# Patient Record
Sex: Female | Born: 1960 | Race: Black or African American | Hispanic: No | Smoking: Never smoker
Health system: Southern US, Community
[De-identification: ages and names within clinical notes are randomized; demographics above are authoritative.]

## PROBLEM LIST (undated history)

## (undated) DIAGNOSIS — E7800 Pure hypercholesterolemia, unspecified: Secondary | ICD-10-CM

## (undated) DIAGNOSIS — E119 Type 2 diabetes mellitus without complications: Secondary | ICD-10-CM

## (undated) DIAGNOSIS — I10 Essential (primary) hypertension: Secondary | ICD-10-CM

## (undated) DIAGNOSIS — E079 Disorder of thyroid, unspecified: Secondary | ICD-10-CM

## (undated) HISTORY — PX: ABDOMINAL HYSTERECTOMY: SHX81

---

## 2011-11-05 ENCOUNTER — Emergency Department (HOSPITAL_COMMUNITY)
Admission: EM | Admit: 2011-11-05 | Discharge: 2011-11-05 | Disposition: A | Discharge status: Home / Self Care | Payer: Self-pay | Attending: Emergency Medicine | Admitting: Emergency Medicine

## 2011-11-05 ENCOUNTER — Encounter (HOSPITAL_COMMUNITY): Payer: Self-pay | Admitting: *Deleted

## 2011-11-05 ENCOUNTER — Emergency Department (HOSPITAL_COMMUNITY): Payer: Self-pay

## 2011-11-05 DIAGNOSIS — S93409A Sprain of unspecified ligament of unspecified ankle, initial encounter: Secondary | ICD-10-CM | POA: Insufficient documentation

## 2011-11-05 DIAGNOSIS — M25579 Pain in unspecified ankle and joints of unspecified foot: Secondary | ICD-10-CM | POA: Insufficient documentation

## 2011-11-05 DIAGNOSIS — M25473 Effusion, unspecified ankle: Secondary | ICD-10-CM | POA: Insufficient documentation

## 2011-11-05 DIAGNOSIS — E079 Disorder of thyroid, unspecified: Secondary | ICD-10-CM | POA: Insufficient documentation

## 2011-11-05 DIAGNOSIS — Z79899 Other long term (current) drug therapy: Secondary | ICD-10-CM | POA: Insufficient documentation

## 2011-11-05 DIAGNOSIS — W010XXA Fall on same level from slipping, tripping and stumbling without subsequent striking against object, initial encounter: Secondary | ICD-10-CM | POA: Insufficient documentation

## 2011-11-05 DIAGNOSIS — S93401A Sprain of unspecified ligament of right ankle, initial encounter: Secondary | ICD-10-CM

## 2011-11-05 DIAGNOSIS — M25476 Effusion, unspecified foot: Secondary | ICD-10-CM | POA: Insufficient documentation

## 2011-11-05 DIAGNOSIS — I10 Essential (primary) hypertension: Secondary | ICD-10-CM | POA: Insufficient documentation

## 2011-11-05 HISTORY — DX: Disorder of thyroid, unspecified: E07.9

## 2011-11-05 HISTORY — DX: Essential (primary) hypertension: I10

## 2011-11-05 MED ORDER — HYDROCODONE-ACETAMINOPHEN 5-325 MG PO TABS
1.0000 | ORAL_TABLET | Freq: Once | ORAL | Status: AC
  Administered 2011-11-05: 1 via ORAL
  Filled 2011-11-05: qty 1

## 2011-11-05 MED ORDER — HYDROCODONE-ACETAMINOPHEN 5-325 MG PO TABS: 1.0000 | ORAL_TABLET | ORAL | Status: AC | PRN

## 2011-11-05 NOTE — ED Notes (Signed)
Tripped and fell 1 week ago. Pain rt ankle, knee and hip. "It keeps giving out".

## 2011-11-05 NOTE — ED Provider Notes (Signed)
History     CSN: [id number]  Arrival date & time 11/05/11  1721   First MD Initiated Contact with Patient 11/05/11 2111      Chief Complaint  Patient presents with  . Ankle Pain    (Consider location/radiation/quality/duration/timing/severity/associated sxs/prior treatment) HPI Comments: The patient is a 51 year old woman who says she tripped and fell a week ago, injuring her right ankle and knee. Her right ankle and has persistently swollen and makes it hard for her to walk. She therefore seeks evaluation. She had no prior treatment for her ankle pain her past history is noteworthy for hypertension.  Patient is a 51 y.o. female presenting with ankle pain. The history is provided by the patient. No language interpreter was used.  Ankle Pain  The incident occurred more than 1 week ago. The injury mechanism was a fall. The pain is present in the right ankle and right knee. The quality of the pain is described as aching. The pain is at a severity of 8/10. The pain is severe. The pain has been constant since onset. She has tried nothing for the symptoms.    Past Medical History  Diagnosis Date  . Hypertension   . Thyroid disease     Past Surgical History  Procedure Date  . Abdominal hysterectomy     History reviewed. No pertinent family history.  History  Substance Use Topics  . Smoking status: Never Smoker   . Smokeless tobacco: Not on file  . Alcohol Use: No    OB History    Grav Para Term Preterm Abortions TAB SAB Ect Mult Living                  Review of Systems  All other systems reviewed and are negative.    Allergies  Aspirin  Home Medications   Current Outpatient Rx  Name Route Sig Dispense Refill  . LEVOTHYROXINE SODIUM PO Oral Take 1 tablet by mouth daily.    Marland Kitchen METFORMIN HCL 500 MG PO TABS Oral Take 500 mg by mouth daily.    Marland Kitchen MOEXIPRIL-HYDROCHLOROTHIAZIDE 15-12.5 MG PO TABS Oral Take 1 tablet by mouth daily.    Marland Kitchen NAPROXEN SODIUM 220 MG PO CAPS  Oral Take 440 mg by mouth daily as needed. For pain    . BYSTOLIC PO Oral Take 1 tablet by mouth daily.      BP 181/82  Pulse 61  Temp(Src) 97.5 F (36.4 C) (Oral)  Resp 20  Ht 5\' 6"  (1.676 m)  Wt 185 lb (83.915 kg)  BMI 29.86 kg/m2  SpO2 100%  Physical Exam  Nursing note and vitals reviewed. Constitutional: She is oriented to person, place, and time. She appears well-developed and well-nourished. No distress.       Systolic blood pressure was high at 181.  HENT:  Head: Normocephalic and atraumatic.  Neck: Normal range of motion. Neck supple.  Musculoskeletal:       Her right knee has no palpable deformity or ligamentous instability there is no effusion. The right ankle has swelling over the lateral malleolus. There is no bony deformity. Skin is intact. She has intact pulses sensation and tendon function in her right foot. X-rays of the right knee are negative x-ray of the right ankle shows swelling over the lateral malleolus but no fracture.  Neurological: She is alert and oriented to person, place, and time.       No sensory or motor deficit.  Skin: Skin is warm and dry.  Psychiatric:  She has a normal mood and affect. Her behavior is normal.    ED Course  Procedures (including critical care time)  Labs Reviewed - No data to display Dg Ankle Complete Right  11/05/2011  *RADIOLOGY REPORT*  Clinical Data: Ankle injury and pain.  RIGHT ANKLE - COMPLETE 3+ VIEW  Comparison: None.  Findings: Diffuse soft tissue swelling is noted.  No evidence of fracture or dislocation.  Mild degenerative spurring is seen along the inferior aspect of the medial malleolus.  Prominent plantar calcaneal spur also noted.  IMPRESSION: Diffuse soft tissue swelling.  No evidence of fracture.  Original Report Authenticated By: Danae Orleans, M.D.   Dg Knee Complete 4 Views Right  11/05/2011  *RADIOLOGY REPORT*  Clinical Data: Knee injury and pain.  RIGHT KNEE - COMPLETE 4+ VIEW  Comparison:  None.  Findings:   There is no evidence of fracture, dislocation, or joint effusion.  There is no evidence of arthropathy or other focal bone abnormality.  Soft tissues are unremarkable.  IMPRESSION: Negative.  Original Report Authenticated By: Danae Orleans, M.D.   9:30 PM I advised her to wear an ASO dressing when up. She can take hydrocodone acetaminophen every 4 hours if needed for pain. I advised her that this injury was just taking longer than she expected to heal.  1. Sprain of right ankle             Carleene Cooper III, MD 11/05/11 2132

## 2011-11-05 NOTE — Discharge Instructions (Signed)
Ankle Sprain An ankle sprain is an injury to the strong, fibrous tissues (ligaments) that hold the bones of your ankle joint together.  CAUSES Ankle sprain usually is caused by a fall or by twisting your ankle. People who participate in sports are more prone to these types of injuries.  SYMPTOMS  Symptoms of ankle sprain include:  Pain in your ankle. The pain may be present at rest or only when you are trying to stand or walk.   Swelling.   Bruising. Bruising may develop immediately or within 1 to 2 days after your injury.   Difficulty standing or walking.  DIAGNOSIS  Your caregiver will ask you details about your injury and perform a physical exam of your ankle to determine if you have an ankle sprain. During the physical exam, your caregiver will press and squeeze specific areas of your foot and ankle. Your caregiver will try to move your ankle in certain ways. An X-ray exam may be done to be sure a bone was not broken or a ligament did not separate from one of the bones in your ankle (avulsion).  TREATMENT  Certain types of braces can help stabilize your ankle. Your caregiver can make a recommendation for this. Your caregiver may recommend the use of medication for pain. If your sprain is severe, your caregiver may refer you to a surgeon who helps to restore function to parts of your skeletal system (orthopedist) or a physical therapist. HOME CARE INSTRUCTIONS  Apply ice to your injury for 1 to 2 days or as directed by your caregiver. Applying ice helps to reduce inflammation and pain.  Put ice in a plastic bag.   Place a towel between your skin and the bag.   Leave the ice on for 15 to 20 minutes at a time, every 2 hours while you are awake.   Take over-the-counter or prescription medicines for pain, discomfort, or fever only as directed by your caregiver.   Keep your injured leg elevated, when possible, to lessen swelling.   If your caregiver recommends crutches, use them as  instructed. Gradually, put weight on the affected ankle. Continue to use crutches or a cane until you can walk without feeling pain in your ankle.   If you have a plaster splint, wear the splint as directed by your caregiver. Do not rest it on anything harder than a pillow the first 24 hours. Do not put weight on it. Do not get it wet. You may take it off to take a shower or bath.   You may have been given an elastic bandage to wear around your ankle to provide support. If the elastic bandage is too tight (you have numbness or tingling in your foot or your foot becomes cold and blue), adjust the bandage to make it comfortable.   If you have an air splint, you may blow more air into it or let air out to make it more comfortable. You may take your splint off at night and before taking a shower or bath.   Wiggle your toes in the splint several times per day if you are able.  SEEK MEDICAL CARE IF:   You have an increase in bruising, swelling, or pain.   Your toes feel cold.   Pain relief is not achieved with medication.  SEEK IMMEDIATE MEDICAL CARE IF: Your toes are numb or blue or you have severe pain. MAKE SURE YOU:   Understand these instructions.   Will watch your condition.     Will get help right away if you are not doing well or get worse.  Document Released: 05/28/2005 Document Revised: 05/17/2011 Document Reviewed: 12/31/2007 [employer] Patient Information 2012 [employer]. 

## 2012-11-10 ENCOUNTER — Emergency Department (HOSPITAL_COMMUNITY)
Admission: EM | Admit: 2012-11-10 | Discharge: 2012-11-10 | Disposition: A | Discharge status: Home / Self Care | Payer: Self-pay | Attending: Emergency Medicine | Admitting: Emergency Medicine

## 2012-11-10 ENCOUNTER — Encounter (HOSPITAL_COMMUNITY): Payer: Self-pay

## 2012-11-10 DIAGNOSIS — R109 Unspecified abdominal pain: Secondary | ICD-10-CM | POA: Insufficient documentation

## 2012-11-10 DIAGNOSIS — Z8679 Personal history of other diseases of the circulatory system: Secondary | ICD-10-CM | POA: Insufficient documentation

## 2012-11-10 DIAGNOSIS — E079 Disorder of thyroid, unspecified: Secondary | ICD-10-CM | POA: Insufficient documentation

## 2012-11-10 DIAGNOSIS — Z90710 Acquired absence of both cervix and uterus: Secondary | ICD-10-CM | POA: Insufficient documentation

## 2012-11-10 DIAGNOSIS — K921 Melena: Secondary | ICD-10-CM | POA: Insufficient documentation

## 2012-11-10 DIAGNOSIS — K625 Hemorrhage of anus and rectum: Secondary | ICD-10-CM | POA: Insufficient documentation

## 2012-11-10 DIAGNOSIS — Z79899 Other long term (current) drug therapy: Secondary | ICD-10-CM | POA: Insufficient documentation

## 2012-11-10 DIAGNOSIS — I10 Essential (primary) hypertension: Secondary | ICD-10-CM | POA: Insufficient documentation

## 2012-11-10 LAB — CBC WITH DIFFERENTIAL/PLATELET
Basophils Absolute: 0 10*3/uL (ref 0.0–0.1)
Basophils Relative: 1 % (ref 0–1)
Eosinophils Relative: 1 % (ref 0–5)
Lymphocytes Relative: 27 % (ref 12–46)
MCHC: 33.3 g/dL (ref 30.0–36.0)
Monocytes Absolute: 0.4 10*3/uL (ref 0.1–1.0)
Neutro Abs: 4.5 10*3/uL (ref 1.7–7.7)
Platelets: 279 10*3/uL (ref 150–400)
RDW: 14.9 % (ref 11.5–15.5)
WBC: 6.7 10*3/uL (ref 4.0–10.5)

## 2012-11-10 LAB — OCCULT BLOOD, POC DEVICE: Fecal Occult Bld: POSITIVE — AB

## 2012-11-10 LAB — BASIC METABOLIC PANEL
CO2: 30 mEq/L (ref 19–32)
Calcium: 9.3 mg/dL (ref 8.4–10.5)
Chloride: 102 mEq/L (ref 96–112)
Creatinine, Ser: 1.17 mg/dL — ABNORMAL HIGH (ref 0.50–1.10)
GFR calc Af Amer: 60 mL/min — ABNORMAL LOW (ref >90)
Sodium: 141 mEq/L (ref 135–145)

## 2012-11-10 NOTE — ED Provider Notes (Signed)
History     This chart was scribed for Jaime Quarry, MD, MD by Smitty Pluck, ED Scribe. The patient was seen in room APA11/APA11 and the patient's care was started at 11:11 AM.   CSN: [id number]  Arrival date & time 11/10/12  1000      Chief Complaint  Patient presents with  . Rectal Bleeding     Patient is a 52 y.o. female presenting with hematochezia. The history is provided by the patient and medical records.  Rectal Bleeding Quality:  Maroon Amount:  Moderate Duration:  6 months Timing:  Intermittent Progression:  Unchanged Chronicity:  Recurrent Similar prior episodes: yes   Relieved by:  None tried Ineffective treatments:  None tried Associated symptoms: abdominal pain   Associated symptoms: no fever    HPI Comments: Jaime Jordan is a 52 y.o. female with hx of HTN, hemorrhoids and thyroid disease who presents to the Emergency Department complaining of intermittent, moderate rectal bleeding onset 6 months ago. She states the blood is dark red and mixed with stool. She had 1 episode of rectal bleeding today. She mentions associated intermittent abdominal pain. She reports that she has been seen in [facility] for same symptoms. She mentions she had colonoscopy 4 years ago and had polyps removed. Pt denies fever, chills, nausea, vomiting, diarrhea, weakness, cough, SOB and any other pain. Denies hx of anemia, gastritis and diverticulosis.    Pt is allergic to asa.   She goes to [employer] in [place]  Past Medical History  Diagnosis Date  . Hypertension   . Thyroid disease     Past Surgical History  Procedure Laterality Date  . Abdominal hysterectomy      No family history on file.  History  Substance Use Topics  . Smoking status: Never Smoker   . Smokeless tobacco: Not on file  . Alcohol Use: No    OB History   Grav Para Term Preterm Abortions TAB SAB Ect Mult Living                  Review of Systems  Constitutional: Negative for fever  and chills.  Gastrointestinal: Positive for abdominal pain, blood in stool, hematochezia and anal bleeding.  All other systems reviewed and are negative.    Allergies  Aspirin  Home Medications   Current Outpatient Rx  Name  Route  Sig  Dispense  Refill  . carvedilol (COREG) 3.125 MG tablet   Oral   Take 3.125 mg by mouth daily.         . celecoxib (CELEBREX) 200 MG capsule   Oral   Take 200 mg by mouth daily.          Marland Kitchen esomeprazole (NEXIUM) 40 MG capsule   Oral   Take 40 mg by mouth daily before breakfast.         . levothyroxine (SYNTHROID, LEVOTHROID) 137 MCG tablet   Oral   Take 137 mcg by mouth daily before breakfast.         . lisinopril-hydrochlorothiazide (PRINZIDE,ZESTORETIC) 20-12.5 MG per tablet   Oral   Take 1 tablet by mouth daily.         . metFORMIN (GLUCOPHAGE-XR) 500 MG 24 hr tablet   Oral   Take 500 mg by mouth daily with breakfast.         . rosuvastatin (CRESTOR) 20 MG tablet   Oral   Take 20 mg by mouth at bedtime.  BP 179/107  Pulse 78  Temp(Src) 98.3 F (36.8 C) (Oral)  Resp 18  Ht 5\' 6"  (1.676 m)  Wt 190 lb (86.183 kg)  BMI 30.68 kg/m2  SpO2 98%  Physical Exam  Nursing note and vitals reviewed. Constitutional: She is oriented to person, place, and time. She appears well-developed and well-nourished. No distress.  HENT:  Head: Normocephalic and atraumatic.  Eyes: Conjunctivae are normal. Pupils are equal, round, and reactive to light.  Neck: Normal range of motion. Neck supple. No JVD present. No tracheal deviation present.  Cardiovascular: Normal rate, regular rhythm and normal heart sounds.   No murmur heard. Pulmonary/Chest: Effort normal and breath sounds normal. No respiratory distress. She has no wheezes. She has no rales.  Abdominal: Soft. She exhibits no distension. There is no tenderness. There is no rebound and no guarding.  Genitourinary:  RN chaperone is present during exam  Positive guaiac  test  Lymphadenopathy:    She has no cervical adenopathy.  Neurological: She is alert and oriented to person, place, and time. No cranial nerve deficit. She exhibits normal muscle tone. Coordination normal.  Skin: Skin is warm and dry.  Psychiatric: She has a normal mood and affect. Her behavior is normal. Judgment and thought content normal.    ED Course  Procedures (including critical care time) DIAGNOSTIC STUDIES: Oxygen Saturation is 98% on room air, normal by my interpretation.    COORDINATION OF CARE: 11:15 AM Discussed ED treatment with pt and pt agrees.    Results for orders placed during the hospital encounter of 11/10/12  CBC WITH DIFFERENTIAL      Result Value Range   WBC 6.7  4.0 - 10.5 K/uL   RBC 3.94  3.87 - 5.11 MIL/uL   Hemoglobin 12.2  12.0 - 15.0 g/dL   HCT 14.7  82.9 - 56.2 %   MCV 92.9  78.0 - 100.0 fL   MCH 31.0  26.0 - 34.0 pg   MCHC 33.3  30.0 - 36.0 g/dL   RDW 13.0  86.5 - 78.4 %   Platelets 279  150 - 400 K/uL   Neutrophils Relative % 66  43 - 77 %   Neutro Abs 4.5  1.7 - 7.7 K/uL   Lymphocytes Relative 27  12 - 46 %   Lymphs Abs 1.8  0.7 - 4.0 K/uL   Monocytes Relative 6  3 - 12 %   Monocytes Absolute 0.4  0.1 - 1.0 K/uL   Eosinophils Relative 1  0 - 5 %   Eosinophils Absolute 0.1  0.0 - 0.7 K/uL   Basophils Relative 1  0 - 1 %   Basophils Absolute 0.0  0.0 - 0.1 K/uL  BASIC METABOLIC PANEL      Result Value Range   Sodium 141  135 - 145 mEq/L   Potassium 3.8  3.5 - 5.1 mEq/L   Chloride 102  96 - 112 mEq/L   CO2 30  19 - 32 mEq/L   Glucose, Bld 105 (*) 70 - 99 mg/dL   BUN 12  6 - 23 mg/dL   Creatinine, Ser 6.96 (*) 0.50 - 1.10 mg/dL   Calcium 9.3  8.4 - 29.5 mg/dL   GFR calc non Af Amer 52 (*) >90 mL/min   GFR calc Af Amer 60 (*) >90 mL/min    No results found.   No diagnosis found.    MDM  Discussed with Dr. Kendell Bane and he will have office call this am with  appointment for colonoscopy.  Patient is given return precautions.  Today  has had intermittent rectal bleeding for 6 months, hemodynamically stable with normal hemoglobin and no real stool in vault although a trace piece was positive.  I personally performed the services described in this documentation, which was scribed in my presence. The recorded information has been reviewed and considered.    Jaime Quarry, MD 11/10/12 1322

## 2012-11-10 NOTE — ED Notes (Signed)
Verbalized understanding of d/c instructions without any questions, gait steady while ambulating out of room

## 2012-11-10 NOTE — ED Notes (Signed)
Pt states she has had intermittent rectal bleeding and lower abd pain for about 6 months.

## 2012-11-24 ENCOUNTER — Ambulatory Visit: Payer: Self-pay | Admitting: Gastroenterology

## 2013-07-05 ENCOUNTER — Emergency Department (HOSPITAL_COMMUNITY)
Admission: EM | Admit: 2013-07-05 | Discharge: 2013-07-05 | Disposition: A | Discharge status: Home / Self Care | Payer: Medicaid - Out of State | Attending: Emergency Medicine | Admitting: Emergency Medicine

## 2013-07-05 ENCOUNTER — Encounter (HOSPITAL_COMMUNITY): Payer: Self-pay | Admitting: Emergency Medicine

## 2013-07-05 DIAGNOSIS — E7800 Pure hypercholesterolemia, unspecified: Secondary | ICD-10-CM | POA: Insufficient documentation

## 2013-07-05 DIAGNOSIS — Z79899 Other long term (current) drug therapy: Secondary | ICD-10-CM | POA: Insufficient documentation

## 2013-07-05 DIAGNOSIS — I10 Essential (primary) hypertension: Secondary | ICD-10-CM | POA: Insufficient documentation

## 2013-07-05 DIAGNOSIS — R040 Epistaxis: Secondary | ICD-10-CM | POA: Insufficient documentation

## 2013-07-05 DIAGNOSIS — R51 Headache: Secondary | ICD-10-CM | POA: Insufficient documentation

## 2013-07-05 DIAGNOSIS — E119 Type 2 diabetes mellitus without complications: Secondary | ICD-10-CM | POA: Insufficient documentation

## 2013-07-05 DIAGNOSIS — E079 Disorder of thyroid, unspecified: Secondary | ICD-10-CM | POA: Insufficient documentation

## 2013-07-05 HISTORY — DX: Pure hypercholesterolemia, unspecified: E78.00

## 2013-07-05 HISTORY — DX: Type 2 diabetes mellitus without complications: E11.9

## 2013-07-05 LAB — PROTIME-INR
INR: 0.98 (ref 0.00–1.49)
Prothrombin Time: 12.8 seconds (ref 11.6–15.2)

## 2013-07-05 LAB — CBC WITH DIFFERENTIAL/PLATELET
Basophils Absolute: 0 10*3/uL (ref 0.0–0.1)
Basophils Relative: 0 % (ref 0–1)
EOS ABS: 0.1 10*3/uL (ref 0.0–0.7)
EOS PCT: 1 % (ref 0–5)
HCT: 39.3 % (ref 36.0–46.0)
HEMOGLOBIN: 12.6 g/dL (ref 12.0–15.0)
LYMPHS ABS: 2.1 10*3/uL (ref 0.7–4.0)
Lymphocytes Relative: 29 % (ref 12–46)
MCH: 30.5 pg (ref 26.0–34.0)
MCHC: 32.1 g/dL (ref 30.0–36.0)
MCV: 95.2 fL (ref 78.0–100.0)
MONOS PCT: 4 % (ref 3–12)
Monocytes Absolute: 0.3 10*3/uL (ref 0.1–1.0)
Neutro Abs: 4.9 10*3/uL (ref 1.7–7.7)
Neutrophils Relative %: 66 % (ref 43–77)
PLATELETS: 329 10*3/uL (ref 150–400)
RBC: 4.13 MIL/uL (ref 3.87–5.11)
RDW: 15 % (ref 11.5–15.5)
WBC: 7.3 10*3/uL (ref 4.0–10.5)

## 2013-07-05 LAB — BASIC METABOLIC PANEL
BUN: 17 mg/dL (ref 6–23)
CALCIUM: 9.4 mg/dL (ref 8.4–10.5)
CO2: 31 mEq/L (ref 19–32)
CREATININE: 1.25 mg/dL — AB (ref 0.50–1.10)
Chloride: 102 mEq/L (ref 96–112)
GFR calc Af Amer: 55 mL/min — ABNORMAL LOW (ref >90)
GFR, EST NON AFRICAN AMERICAN: 48 mL/min — AB (ref >90)
GLUCOSE: 103 mg/dL — AB (ref 70–99)
Potassium: 4 mEq/L (ref 3.7–5.3)
Sodium: 141 mEq/L (ref 137–147)

## 2013-07-05 MED ORDER — HYDROCODONE-ACETAMINOPHEN 5-325 MG PO TABS: 2.0000 | ORAL_TABLET | ORAL | Status: DC | PRN

## 2013-07-05 MED ORDER — OXYMETAZOLINE HCL 0.05 % NA SOLN
1.0000 | Freq: Once | NASAL | Status: AC
  Administered 2013-07-05: 1 via NASAL

## 2013-07-05 MED ORDER — HYDROCODONE-ACETAMINOPHEN 5-325 MG PO TABS
1.0000 | ORAL_TABLET | Freq: Once | ORAL | Status: AC
  Administered 2013-07-05: 1 via ORAL
  Filled 2013-07-05: qty 1

## 2013-07-05 MED ORDER — CEPHALEXIN 500 MG PO CAPS: 500.0000 mg | ORAL_CAPSULE | Freq: Four times a day (QID) | ORAL | Status: DC

## 2013-07-05 MED ORDER — OXYMETAZOLINE HCL 0.05 % NA SOLN
NASAL | Status: AC
  Filled 2013-07-05: qty 15

## 2013-07-05 MED ORDER — SILVER NITRATE-POT NITRATE 75-25 % EX MISC
CUTANEOUS | Status: AC
  Filled 2013-07-05: qty 2

## 2013-07-05 NOTE — ED Notes (Signed)
Pt c/o "mild" headache and nose bleed from left nare that started 20-30 minutes ago, pt hypertensive in triage 221/104, admits to taking blood [pressure medication this am, left nare still bleeding at present,

## 2013-07-05 NOTE — Discharge Instructions (Signed)

## 2013-07-05 NOTE — ED Notes (Signed)
Spontaneous nose bleed that started today. Hypertensive. Atraumatic.

## 2013-07-05 NOTE — ED Notes (Signed)
Patient with no complaints at this time. Respirations even and unlabored. Skin warm/dry. Discharge instructions reviewed with patient at this time. Patient given opportunity to voice concerns/ask questions. Patient discharged at this time and left Emergency Department with steady gait.   

## 2013-07-05 NOTE — ED Provider Notes (Signed)
CSN: [id number]     Arrival date & time 07/05/13  1137 History   First MD Initiated Contact with Patient 07/05/13 1213     Chief Complaint  Patient presents with  . Epistaxis   (Consider location/radiation/quality/duration/timing/severity/associated sxs/prior Treatment) HPI Comments: Spontaneous nosebleed from left naris started 30 minutes ago. History of hypertension. States compliance with medications. Denies any nasal trauma. No anticoagulant use. No chest pain, shortness of breath, difficulty breathing or swallowing.  The history is provided by the patient. The history is limited by the condition of the patient.    Past Medical History  Diagnosis Date  . Hypertension   . Thyroid disease   . Diabetes mellitus without complication   . High cholesterol    Past Surgical History  Procedure Laterality Date  . Abdominal hysterectomy     No family history on file. History  Substance Use Topics  . Smoking status: Never Smoker   . Smokeless tobacco: Not on file  . Alcohol Use: No   OB History   Grav Para Term Preterm Abortions TAB SAB Ect Mult Living                 Review of Systems  Constitutional: Negative for activity change and appetite change.  HENT: Positive for nosebleeds.   Respiratory: Negative for cough, chest tightness and shortness of breath.   Cardiovascular: Negative for chest pain.  Gastrointestinal: Negative for nausea, vomiting and abdominal pain.  Genitourinary: Negative for dysuria, hematuria, vaginal bleeding and vaginal discharge.  Musculoskeletal: Negative for back pain.  Skin: Negative for rash.  Neurological: Positive for headaches. Negative for dizziness.  A complete 10 system review of systems was obtained and all systems are negative except as noted in the HPI and PMH.    Allergies  Aspirin  Home Medications   Current Outpatient Rx  Name  Route  Sig  Dispense  Refill  . carvedilol (COREG) 3.125 MG tablet   Oral   Take 3.125 mg by mouth  daily.         . celecoxib (CELEBREX) 200 MG capsule   Oral   Take 200 mg by mouth daily.          Marland Kitchen esomeprazole (NEXIUM) 40 MG capsule   Oral   Take 40 mg by mouth daily before breakfast.         . levothyroxine (SYNTHROID, LEVOTHROID) 137 MCG tablet   Oral   Take 137 mcg by mouth daily before breakfast.         . lisinopril-hydrochlorothiazide (PRINZIDE,ZESTORETIC) 20-12.5 MG per tablet   Oral   Take 1 tablet by mouth daily.         . metFORMIN (GLUCOPHAGE-XR) 500 MG 24 hr tablet   Oral   Take 500 mg by mouth daily with breakfast.         . rosuvastatin (CRESTOR) 20 MG tablet   Oral   Take 20 mg by mouth at bedtime.         . cephALEXin (KEFLEX) 500 MG capsule   Oral   Take 1 capsule (500 mg total) by mouth 4 (four) times daily.   40 capsule   0   . HYDROcodone-acetaminophen (NORCO/VICODIN) 5-325 MG per tablet   Oral   Take 2 tablets by mouth every 4 (four) hours as needed.   10 tablet   0    BP 146/85  Pulse 66  Temp(Src) 97.5 F (36.4 C) (Oral)  Resp 16  SpO2 99% Physical Exam  Constitutional: She is oriented to person, place, and time. She appears well-developed and well-nourished. No distress.  HENT:  Head: Normocephalic.  Mouth/Throat: Oropharynx is clear and moist. No oropharyngeal exudate.  Active bleeding from left nasal septum, bleeding in back of oropharynx.  Eyes: Conjunctivae and EOM are normal. Pupils are equal, round, and reactive to light.  Neck: Normal range of motion. Neck supple.  Cardiovascular: Normal rate, regular rhythm and normal heart sounds.   No murmur heard. Pulmonary/Chest: Breath sounds normal. No respiratory distress.  Abdominal: Soft. There is no tenderness. There is no rebound and no guarding.  Musculoskeletal: Normal range of motion. She exhibits no edema and no tenderness.  Neurological: She is alert and oriented to person, place, and time. No cranial nerve deficit. She exhibits normal muscle tone.  Coordination normal.  Skin: Skin is warm.    ED Course  EPISTAXIS MANAGEMENT Date/Time: 07/05/2013 1:09 PM Performed by: Glynn OctaveANCOUR, Loraine Freid Authorized by: Glynn OctaveANCOUR, Janoah Menna Consent: Verbal consent obtained. The procedure was performed in an emergent situation. Risks and benefits: risks, benefits and alternatives were discussed Consent given by: patient Patient understanding: patient states understanding of the procedure being performed Patient consent: the patient's understanding of the procedure matches consent given Patient identity confirmed: verbally with patient and provided demographic data Time out: Immediately prior to procedure a "time out" was called to verify the correct patient, procedure, equipment, support staff and site/side marked as required. Local anesthetic: lidocaine 1% with epinephrine Patient sedated: no Treatment site: left anterior Repair method: cophenylcaine, nasal balloon and anterior pack Post-procedure assessment: bleeding stopped Treatment complexity: complex Patient tolerance: Patient tolerated the procedure well with no immediate complications.   (including critical care time) Labs Review Labs Reviewed  BASIC METABOLIC PANEL - Abnormal; Notable for the following:    Glucose, Bld 103 (*)    Creatinine, Ser 1.25 (*)    GFR calc non Af Amer 48 (*)    GFR calc Af Amer 55 (*)    All other components within normal limits  CBC WITH DIFFERENTIAL  PROTIME-INR   Imaging Review No results found.  EKG Interpretation   None       MDM   1. Epistaxis    Spontaneous nose bleed in setting of hypertension. No difficulty breathing, chest pain or shortness of breath.  Patient observed in ED with no further bleeding after nasal packing. Blood pressure has improved to 140 systolic. She states compliance with medications.  No blood in the oropharynx on recheck. Hemoglobin stable.   Patient will be discharged with nasal packing in place and followup with ENT  in 3 days.  BP 146/85  Pulse 66  Temp(Src) 97.5 F (36.4 C) (Oral)  Resp 16  SpO2 99%   Glynn OctaveStephen Raevyn Sokol, MD 07/05/13 1621

## 2013-07-08 ENCOUNTER — Encounter (HOSPITAL_COMMUNITY): Payer: Self-pay | Admitting: Emergency Medicine

## 2013-07-08 ENCOUNTER — Emergency Department (HOSPITAL_COMMUNITY)
Admission: EM | Admit: 2013-07-08 | Discharge: 2013-07-08 | Disposition: A | Discharge status: Home / Self Care | Payer: Medicaid - Out of State | Attending: Emergency Medicine | Admitting: Emergency Medicine

## 2013-07-08 DIAGNOSIS — Z792 Long term (current) use of antibiotics: Secondary | ICD-10-CM | POA: Insufficient documentation

## 2013-07-08 DIAGNOSIS — Z79899 Other long term (current) drug therapy: Secondary | ICD-10-CM | POA: Insufficient documentation

## 2013-07-08 DIAGNOSIS — I10 Essential (primary) hypertension: Secondary | ICD-10-CM | POA: Insufficient documentation

## 2013-07-08 DIAGNOSIS — Z4800 Encounter for change or removal of nonsurgical wound dressing: Secondary | ICD-10-CM

## 2013-07-08 DIAGNOSIS — E119 Type 2 diabetes mellitus without complications: Secondary | ICD-10-CM | POA: Insufficient documentation

## 2013-07-08 DIAGNOSIS — E7800 Pure hypercholesterolemia, unspecified: Secondary | ICD-10-CM | POA: Insufficient documentation

## 2013-07-08 DIAGNOSIS — E079 Disorder of thyroid, unspecified: Secondary | ICD-10-CM | POA: Insufficient documentation

## 2013-07-08 NOTE — ED Provider Notes (Signed)
CSN: [id number]     Arrival date & time 07/08/13  [id number] History   First MD Initiated Contact with Patient 07/08/13 0940     No chief complaint on file.  (Consider location/radiation/quality/duration/timing/severity/associated sxs/prior Treatment) HPI Comments: Patient is a 53 year old female who presented to the emergency department on Sunday, January 25 with a complaint of nosebleed. The patient was noted to have some elevation in blood pressure. This was addressed. The patient had a rhino rocket with anterior packing placed. The patient presents now for evaluation to have the packing removed. The patient states that she has not had any significant bleeding since being treated on Sunday. She has not seen her primary physician concerning her blood pressure yet. There's been no unusual headache. No unusual dizziness or lightheadedness. The patient denies excessive use of aspirin or nonsteroidal anti-inflammatory medications. She's not on any blood thinning type medications.  The history is provided by the patient.    Past Medical History  Diagnosis Date  . Hypertension   . Thyroid disease   . Diabetes mellitus without complication   . High cholesterol    Past Surgical History  Procedure Laterality Date  . Abdominal hysterectomy     History reviewed. No pertinent family history. History  Substance Use Topics  . Smoking status: Never Smoker   . Smokeless tobacco: Not on file  . Alcohol Use: No   OB History   Grav Para Term Preterm Abortions TAB SAB Ect Mult Living                 Review of Systems  Constitutional: Negative for activity change.       All ROS Neg except as noted in HPI  HENT: Positive for nosebleeds.   Eyes: Negative for photophobia and discharge.  Respiratory: Negative for cough, shortness of breath and wheezing.   Cardiovascular: Negative for chest pain and palpitations.  Gastrointestinal: Negative for abdominal pain and blood in stool.  Genitourinary: Negative  for dysuria, frequency and hematuria.  Musculoskeletal: Negative for arthralgias, back pain and neck pain.  Skin: Negative.   Neurological: Negative for dizziness, seizures and speech difficulty.  Psychiatric/Behavioral: Negative for hallucinations and confusion.    Allergies  Aspirin  Home Medications   Current Outpatient Rx  Name  Route  Sig  Dispense  Refill  . carvedilol (COREG) 3.125 MG tablet   Oral   Take 3.125 mg by mouth daily.         . celecoxib (CELEBREX) 200 MG capsule   Oral   Take 200 mg by mouth daily.          . cephALEXin (KEFLEX) 500 MG capsule   Oral   Take 1 capsule (500 mg total) by mouth 4 (four) times daily.   40 capsule   0   . esomeprazole (NEXIUM) 40 MG capsule   Oral   Take 40 mg by mouth daily before breakfast.         . HYDROcodone-acetaminophen (NORCO/VICODIN) 5-325 MG per tablet   Oral   Take 2 tablets by mouth every 4 (four) hours as needed.   10 tablet   0   . levothyroxine (SYNTHROID, LEVOTHROID) 137 MCG tablet   Oral   Take 137 mcg by mouth daily before breakfast.         . lisinopril-hydrochlorothiazide (PRINZIDE,ZESTORETIC) 20-12.5 MG per tablet   Oral   Take 1 tablet by mouth daily.         . metFORMIN (GLUCOPHAGE-XR) 500 MG 24  hr tablet   Oral   Take 500 mg by mouth daily with breakfast.         . rosuvastatin (CRESTOR) 20 MG tablet   Oral   Take 20 mg by mouth at bedtime.          BP 199/82  Pulse 66  Temp(Src) 97.6 F (36.4 C) (Oral)  Resp 16  SpO2 99% Physical Exam  Nursing note and vitals reviewed. Constitutional: She is oriented to person, place, and time. She appears well-developed and well-nourished.  Non-toxic appearance.  HENT:  Head: Normocephalic.  Right Ear: Tympanic membrane and external ear normal.  Left Ear: Tympanic membrane and external ear normal.  Rhino rocket in place in the left nostril. No bleeding appreciated. No increased redness or signs of any problem around the right or  left nostrils.  No blood in the posterior pharynx.  Eyes: EOM and lids are normal. Pupils are equal, round, and reactive to light.  Neck: Normal range of motion. Neck supple. Carotid bruit is not present.  Cardiovascular: Normal rate, regular rhythm, normal heart sounds, intact distal pulses and normal pulses.   Pulmonary/Chest: Breath sounds normal. No respiratory distress.  Abdominal: Soft. Bowel sounds are normal. There is no tenderness. There is no guarding.  Musculoskeletal: Normal range of motion.  Lymphadenopathy:       Head (right side): No submandibular adenopathy present.       Head (left side): No submandibular adenopathy present.    She has no cervical adenopathy.  Neurological: She is alert and oriented to person, place, and time. She has normal strength. No cranial nerve deficit or sensory deficit.  Skin: Skin is warm and dry.  Psychiatric: She has a normal mood and affect. Her speech is normal.    ED Course  Procedures (including critical care time) Labs Review Labs Reviewed - No data to display Imaging Review No results found.  EKG Interpretation   None       MDM  No diagnosis found. *I have reviewed nursing notes, vital signs, and all appropriate lab and imaging results for this patient.**  Patient presents for removal of a rhino rocket from the left nostril do to epistaxis.  Rhino Rocket was removed without problem. Patient's blood pressure is 199/82. This was elevated on the patient's last visit. The patient advised of her blood pressure at this time, and strongly encouraged to see her primary physician for possible adjustments in her blood pressure medications. The patient states that she is compliant with her current medications. Patient given instructions on how to apply pressure and pinch her nose if bleeding returns. Patient also given a reconstruction is to return if bleeding not controlled. Patient ate knowledge is understanding of the  instructions.  Kathie Dike, PA-C 07/08/13 1034

## 2013-07-08 NOTE — Discharge Instructions (Signed)
Your nasal packing was removed without problem today. Please apply pressure to the nostril with pinching if the bleeding should return. Please return to the emergency department if bleeding unable to be controlled. Your blood pressure is elevated. It was elevated on your previous emergency department visit. It is very important that you see your primary physician for evaluation and possible adjustment of your medications.

## 2013-07-08 NOTE — ED Provider Notes (Signed)
Medical screening examination/treatment/procedure(s) were performed by non-physician practitioner and as supervising physician I was immediately available for consultation/collaboration.  EKG Interpretation   None         Elwood Bazinet W. Bryant Saye, MD 07/08/13 1536 

## 2013-07-08 NOTE — ED Notes (Signed)
Pt had rhino rocket placed in left nostril on Sunday. Pt was told to return for re-evaluation and removal today. Pt denies any pain, bleeding at site. Pt also denies headaches, lightheadedness.

## 2013-12-18 ENCOUNTER — Emergency Department (HOSPITAL_COMMUNITY): Payer: Medicaid - Out of State

## 2013-12-18 ENCOUNTER — Encounter (HOSPITAL_COMMUNITY): Payer: Self-pay | Admitting: Emergency Medicine

## 2013-12-18 ENCOUNTER — Emergency Department (HOSPITAL_COMMUNITY)
Admission: EM | Admit: 2013-12-18 | Discharge: 2013-12-18 | Disposition: A | Discharge status: Home / Self Care | Payer: Medicaid - Out of State | Attending: Emergency Medicine | Admitting: Emergency Medicine

## 2013-12-18 DIAGNOSIS — E079 Disorder of thyroid, unspecified: Secondary | ICD-10-CM | POA: Diagnosis not present

## 2013-12-18 DIAGNOSIS — R52 Pain, unspecified: Secondary | ICD-10-CM | POA: Insufficient documentation

## 2013-12-18 DIAGNOSIS — M25559 Pain in unspecified hip: Secondary | ICD-10-CM | POA: Insufficient documentation

## 2013-12-18 DIAGNOSIS — M79609 Pain in unspecified limb: Secondary | ICD-10-CM | POA: Diagnosis present

## 2013-12-18 DIAGNOSIS — S56911A Strain of unspecified muscles, fascia and tendons at forearm level, right arm, initial encounter: Secondary | ICD-10-CM

## 2013-12-18 DIAGNOSIS — Y939 Activity, unspecified: Secondary | ICD-10-CM | POA: Diagnosis not present

## 2013-12-18 DIAGNOSIS — X58XXXA Exposure to other specified factors, initial encounter: Secondary | ICD-10-CM | POA: Diagnosis not present

## 2013-12-18 DIAGNOSIS — Z79899 Other long term (current) drug therapy: Secondary | ICD-10-CM | POA: Insufficient documentation

## 2013-12-18 DIAGNOSIS — IMO0002 Reserved for concepts with insufficient information to code with codable children: Secondary | ICD-10-CM | POA: Diagnosis not present

## 2013-12-18 DIAGNOSIS — Z791 Long term (current) use of non-steroidal anti-inflammatories (NSAID): Secondary | ICD-10-CM | POA: Diagnosis not present

## 2013-12-18 DIAGNOSIS — M25552 Pain in left hip: Secondary | ICD-10-CM

## 2013-12-18 DIAGNOSIS — Y929 Unspecified place or not applicable: Secondary | ICD-10-CM | POA: Diagnosis not present

## 2013-12-18 DIAGNOSIS — I10 Essential (primary) hypertension: Secondary | ICD-10-CM | POA: Diagnosis not present

## 2013-12-18 DIAGNOSIS — E119 Type 2 diabetes mellitus without complications: Secondary | ICD-10-CM | POA: Diagnosis not present

## 2013-12-18 LAB — CBC WITH DIFFERENTIAL/PLATELET
BASOS PCT: 0 % (ref 0–1)
Basophils Absolute: 0 10*3/uL (ref 0.0–0.1)
EOS ABS: 0.1 10*3/uL (ref 0.0–0.7)
EOS PCT: 2 % (ref 0–5)
HEMATOCRIT: 39 % (ref 36.0–46.0)
HEMOGLOBIN: 12.7 g/dL (ref 12.0–15.0)
LYMPHS ABS: 2.1 10*3/uL (ref 0.7–4.0)
Lymphocytes Relative: 31 % (ref 12–46)
MCH: 30.2 pg (ref 26.0–34.0)
MCHC: 32.6 g/dL (ref 30.0–36.0)
MCV: 92.6 fL (ref 78.0–100.0)
MONOS PCT: 6 % (ref 3–12)
Monocytes Absolute: 0.4 10*3/uL (ref 0.1–1.0)
NEUTROS PCT: 61 % (ref 43–77)
Neutro Abs: 4.2 10*3/uL (ref 1.7–7.7)
Platelets: 284 10*3/uL (ref 150–400)
RBC: 4.21 MIL/uL (ref 3.87–5.11)
RDW: 15.8 % — ABNORMAL HIGH (ref 11.5–15.5)
WBC: 6.9 10*3/uL (ref 4.0–10.5)

## 2013-12-18 LAB — BASIC METABOLIC PANEL
Anion gap: 10 (ref 5–15)
BUN: 15 mg/dL (ref 6–23)
CO2: 31 meq/L (ref 19–32)
CREATININE: 1.24 mg/dL — AB (ref 0.50–1.10)
Calcium: 9.3 mg/dL (ref 8.4–10.5)
Chloride: 101 mEq/L (ref 96–112)
GFR calc non Af Amer: 48 mL/min — ABNORMAL LOW (ref >90)
GFR, EST AFRICAN AMERICAN: 56 mL/min — AB (ref >90)
Glucose, Bld: 99 mg/dL (ref 70–99)
POTASSIUM: 4.5 meq/L (ref 3.7–5.3)
Sodium: 142 mEq/L (ref 137–147)

## 2013-12-18 LAB — TROPONIN I: Troponin I: <0.3 ng/mL (ref <0.30)

## 2013-12-18 MED ORDER — HYDROCODONE-ACETAMINOPHEN 5-325 MG PO TABS: 1.0000 | ORAL_TABLET | ORAL | Status: DC | PRN

## 2013-12-18 MED ORDER — HYDROCODONE-ACETAMINOPHEN 5-325 MG PO TABS
1.0000 | ORAL_TABLET | Freq: Once | ORAL | Status: AC
  Administered 2013-12-18: 1 via ORAL
  Filled 2013-12-18: qty 1

## 2013-12-18 NOTE — Discharge Instructions (Signed)
Your xrays are negative today for the source of your pain.  I suspect you have muscle strain and overuse syndrome from your activities with your job.  Rest and apply ice packs to your areas of pain for the next several days.  You may take the hydrocodone prescribed for pain relief.  This will make you drowsy - do not drive within 4 hours of taking this medication.

## 2013-12-18 NOTE — ED Provider Notes (Signed)
Medical screening examination/treatment/procedure(s) were conducted as a shared visit with non-physician practitioner(s) and myself.  I personally evaluated the patient during the encounter.   EKG Interpretation None     No clinical evidence of acute coronary syndrome. Suspect musculoskeletal pain  Donnetta HutchingBrian Shermika Balthaser, MD 12/18/13 1444

## 2013-12-18 NOTE — ED Provider Notes (Signed)
CSN: [id number]     Arrival date & time 12/18/13  [id number] History   First MD Initiated Contact with Patient 12/18/13 1003     Chief Complaint  Patient presents with  . Extremity Pain     (Consider location/radiation/quality/duration/timing/severity/associated sxs/prior Treatment) Patient is a 53 y.o. female presenting with extremity pain.  Extremity Pain Associated symptoms include arthralgias. Pertinent negatives include no chest pain, fever, joint swelling, myalgias, neck pain, numbness, rash, vomiting or weakness.     Mathis Dadmily Bias is a 53 y.o. female with a medical history significant for DM and htn presenting with a 2 week history of right forearm and elbow pain and left lateral hip pain.  Her right forearm is described as a deep, aching pain which is constant, even at rest, but worsens with movement.  She denies injury, but describes some lifting and increased physical activity with her current job.  Her left hip is sore and worsened with walking, weight bearing and palpation.  She denies injury or falls and denies chest pain, shortness of breath, fever, rash, neck or back pain.  She has taken aleve without relief of her symptoms.  She has mentioned her pain to her pcp at the [facility] in [place], but states has had no treatment of this.      Past Medical History  Diagnosis Date  . Hypertension   . Thyroid disease   . Diabetes mellitus without complication   . High cholesterol    Past Surgical History  Procedure Laterality Date  . Abdominal hysterectomy     History reviewed. No pertinent family history. History  Substance Use Topics  . Smoking status: Never Smoker   . Smokeless tobacco: Not on file  . Alcohol Use: No   OB History   Grav Para Term Preterm Abortions TAB SAB Ect Mult Living                 Review of Systems  Constitutional: Negative for fever.  Respiratory: Negative for shortness of breath.   Cardiovascular: Negative for chest pain.  Gastrointestinal:  Negative for vomiting and diarrhea.  Musculoskeletal: Positive for arthralgias. Negative for back pain, gait problem, joint swelling, myalgias and neck pain.  Skin: Negative for color change and rash.  Neurological: Negative for weakness and numbness.      Allergies  Aspirin  Home Medications   Prior to Admission medications   Medication Sig Start Date End Date Taking? Authorizing Provider  carvedilol (COREG) 3.125 MG tablet Take 3.125 mg by mouth daily.   Yes Historical Provider, MD  esomeprazole (NEXIUM) 40 MG capsule Take 40 mg by mouth daily as needed (acid reflux).    Yes Historical Provider, MD  levothyroxine (SYNTHROID, LEVOTHROID) 137 MCG tablet Take 137 mcg by mouth daily before breakfast.   Yes Historical Provider, MD  metFORMIN (GLUCOPHAGE-XR) 500 MG 24 hr tablet Take 500 mg by mouth daily with breakfast.   Yes Historical Provider, MD  naproxen sodium (ANAPROX) 220 MG tablet Take 440 mg by mouth daily as needed (pain).   Yes Historical Provider, MD  HYDROcodone-acetaminophen (NORCO/VICODIN) 5-325 MG per tablet Take 1 tablet by mouth every 4 (four) hours as needed for moderate pain. 12/18/13   Burgess AmorJulie Zariel Capano, PA-C   BP 179/96  Pulse 73  Temp(Src) 98.1 F (36.7 C) (Oral)  Resp 20  Ht 5\' 6"  (1.676 m)  Wt 243 lb (110.224 kg)  BMI 39.24 kg/m2  SpO2 100% Physical Exam  Constitutional: She appears well-developed and well-nourished.  HENT:  Head: Atraumatic.  Neck: Normal range of motion.  Cardiovascular:  Pulses equal bilaterally  Musculoskeletal: She exhibits tenderness.       Left hip: She exhibits bony tenderness.  Lateral left hip tender over greater trochanter.    Neurological: She is alert. She has normal strength. She displays normal reflexes. No sensory deficit.  Skin: Skin is warm and dry.  Psychiatric: She has a normal mood and affect.    ED Course  Procedures (including critical care time) Labs Review Labs Reviewed  BASIC METABOLIC PANEL - Abnormal; Notable  for the following:    Creatinine, Ser 1.24 (*)    GFR calc non Af Amer 48 (*)    GFR calc Af Amer 56 (*)    All other components within normal limits  CBC WITH DIFFERENTIAL - Abnormal; Notable for the following:    RDW 15.8 (*)    All other components within normal limits  TROPONIN I    Imaging Review Dg Forearm Right  12/18/2013   CLINICAL DATA:  Pain  EXAM: RIGHT FOREARM - 2 VIEW  COMPARISON:  None.  FINDINGS: There is no evidence of fracture or other focal bone lesions. Soft tissues are unremarkable. Normal alignment. Right radius and ulna are intact.  IMPRESSION: No acute osseous finding   Electronically Signed   By: Ruel Favors M.D.   On: 12/18/2013 11:05   Dg Hip Complete Left  12/18/2013   CLINICAL DATA:  Left hip pain.  EXAM: LEFT HIP - COMPLETE 2+ VIEW  COMPARISON:  None.  FINDINGS: There is no evidence of hip fracture or dislocation. There is no evidence of arthropathy or other focal bone abnormality.  IMPRESSION: Negative left hip radiographs   Electronically Signed   By: Gennette Pac M.D.   On: 12/18/2013 11:06     EKG Interpretation None       Date: 12/18/2013  Rate: 67  Rhythm: normal sinus rhythm  QRS Axis: normal  Intervals: normal  ST/T Wave abnormalities: T wave inversion in lateral leads  Conduction Disutrbances:none  Narrative Interpretation:   Old EKG Reviewed: obtained ekg from pcp dated 12/11/11, unchanged.      MDM   Final diagnoses:  Forearm strain, right, initial encounter  Hip pain, acute, left    Patients labs and/or radiological studies were viewed and considered during the medical decision making and disposition process. Pt's exam is most c/w musculoskeletal strain, overuse syndrome.  ekg unchanged,  Negative troponin, this is not an atypical ACS equivalent.  xrays negative for bony pathology.  Pt was encouraged avoiding activities that worsen her pain.  Heat tx, f/u with pcp ([facility] in [place]) prn if sx persist.      Burgess Amor, PA-C 12/18/13 1423

## 2013-12-18 NOTE — ED Notes (Addendum)
Pt reports right arm and left leg pain x1 week. Pt denies any injury. nad noted. Pt denies any dizziness,weakness,numbness at this time.

## 2014-02-14 ENCOUNTER — Emergency Department (HOSPITAL_COMMUNITY)
Admission: EM | Admit: 2014-02-14 | Discharge: 2014-02-14 | Disposition: A | Discharge status: Home / Self Care | Payer: Medicaid - Out of State | Attending: Emergency Medicine | Admitting: Emergency Medicine

## 2014-02-14 ENCOUNTER — Encounter (HOSPITAL_COMMUNITY): Payer: Self-pay | Admitting: Emergency Medicine

## 2014-02-14 ENCOUNTER — Emergency Department (HOSPITAL_COMMUNITY): Payer: Medicaid - Out of State

## 2014-02-14 DIAGNOSIS — Y9389 Activity, other specified: Secondary | ICD-10-CM | POA: Insufficient documentation

## 2014-02-14 DIAGNOSIS — I10 Essential (primary) hypertension: Secondary | ICD-10-CM | POA: Diagnosis not present

## 2014-02-14 DIAGNOSIS — S6990XA Unspecified injury of unspecified wrist, hand and finger(s), initial encounter: Secondary | ICD-10-CM | POA: Insufficient documentation

## 2014-02-14 DIAGNOSIS — E119 Type 2 diabetes mellitus without complications: Secondary | ICD-10-CM | POA: Diagnosis not present

## 2014-02-14 DIAGNOSIS — S6390XA Sprain of unspecified part of unspecified wrist and hand, initial encounter: Secondary | ICD-10-CM | POA: Insufficient documentation

## 2014-02-14 DIAGNOSIS — W208XXA Other cause of strike by thrown, projected or falling object, initial encounter: Secondary | ICD-10-CM | POA: Diagnosis not present

## 2014-02-14 DIAGNOSIS — Z79899 Other long term (current) drug therapy: Secondary | ICD-10-CM | POA: Diagnosis not present

## 2014-02-14 DIAGNOSIS — Y990 Civilian activity done for income or pay: Secondary | ICD-10-CM | POA: Insufficient documentation

## 2014-02-14 DIAGNOSIS — E079 Disorder of thyroid, unspecified: Secondary | ICD-10-CM | POA: Insufficient documentation

## 2014-02-14 DIAGNOSIS — Y9289 Other specified places as the place of occurrence of the external cause: Secondary | ICD-10-CM | POA: Insufficient documentation

## 2014-02-14 DIAGNOSIS — Z791 Long term (current) use of non-steroidal anti-inflammatories (NSAID): Secondary | ICD-10-CM | POA: Insufficient documentation

## 2014-02-14 DIAGNOSIS — S63602A Unspecified sprain of left thumb, initial encounter: Secondary | ICD-10-CM

## 2014-02-14 MED ORDER — NAPROXEN 500 MG PO TABS: 500.0000 mg | ORAL_TABLET | Freq: Two times a day (BID) | ORAL | Status: DC

## 2014-02-14 NOTE — ED Notes (Signed)
Patient with no complaints at this time. Respirations even and unlabored. Skin warm/dry. Discharge instructions reviewed with patient at this time. Patient given opportunity to voice concerns/ask questions. Patient discharged at this time and left Emergency Department with steady gait.   

## 2014-02-14 NOTE — ED Provider Notes (Signed)
CSN: [id number]     Arrival date & time 02/14/14  0946 History   First MD Initiated Contact with Patient 02/14/14 [phone]     Chief Complaint  Patient presents with  . Hand Pain   Jaime Jordan is a 53 y.o. female who presents to the Emergency Department complaining of left thumb pain for one week.  She states the pain began after several boxes of frozen food fell on her hand. She complains of continued pain with movement of the thumb, primarily flexion of the thumb toward the palm. She is applied ice packs intermittently without relief. She states nothing makes the pain better. She denies open wounds to the hand, redness, numbness or weakness of the hand, wrist pain or pain with movement of the remaining fingers. She also denies previous injuries to her left thumb. Patient is right-hand dominant.   (Consider location/radiation/quality/duration/timing/severity/associated sxs/prior Treatment) Patient is a 53 y.o. female presenting with hand pain.  Hand Pain Associated symptoms include arthralgias and joint swelling. Pertinent negatives include no chills, fever, numbness or weakness.      Past Medical History  Diagnosis Date  . Hypertension   . Thyroid disease   . Diabetes mellitus without complication   . High cholesterol    Past Surgical History  Procedure Laterality Date  . Abdominal hysterectomy     No family history on file. History  Substance Use Topics  . Smoking status: Never Smoker   . Smokeless tobacco: Not on file  . Alcohol Use: No   OB History   Grav Para Term Preterm Abortions TAB SAB Ect Mult Living                 Review of Systems  Constitutional: Negative for fever and chills.  Musculoskeletal: Positive for arthralgias and joint swelling.  Skin: Negative for color change and wound.  Neurological: Negative for dizziness, weakness and numbness.  All other systems reviewed and are negative.     Allergies  Aspirin  Home Medications   Prior to Admission  medications   Medication Sig Start Date End Date Taking? Authorizing Provider  carvedilol (COREG) 3.125 MG tablet Take 3.125 mg by mouth daily.    Historical Provider, MD  esomeprazole (NEXIUM) 40 MG capsule Take 40 mg by mouth daily as needed (acid reflux).     Historical Provider, MD  HYDROcodone-acetaminophen (NORCO/VICODIN) 5-325 MG per tablet Take 1 tablet by mouth every 4 (four) hours as needed for moderate pain. 12/18/13   Burgess Amor, PA-C  levothyroxine (SYNTHROID, LEVOTHROID) 137 MCG tablet Take 137 mcg by mouth daily before breakfast.    Historical Provider, MD  metFORMIN (GLUCOPHAGE-XR) 500 MG 24 hr tablet Take 500 mg by mouth daily with breakfast.    Historical Provider, MD  naproxen sodium (ANAPROX) 220 MG tablet Take 440 mg by mouth daily as needed (pain).    Historical Provider, MD   BP 172/90  Pulse 69  Temp(Src) 97.7 F (36.5 C) (Oral)  Resp 18  Ht  (1.676 m)  Wt 194 lb (87.998 kg)  BMI 31.33 kg/m2  SpO2 100% Physical Exam  Nursing note and vitals reviewed. Constitutional: She is oriented to person, place, and time. She appears well-developed and well-nourished. No distress.  HENT:  Head: Normocephalic and atraumatic.  Cardiovascular: Normal rate, regular rhythm, normal heart sounds and intact distal pulses.   No murmur heard. Pulmonary/Chest: Effort normal and breath sounds normal. No respiratory distress.  Musculoskeletal: She exhibits edema and tenderness.  Left hand: She exhibits tenderness and swelling. She exhibits normal range of motion, no bony tenderness, normal capillary refill, no deformity and no laceration. Normal sensation noted.       Hands: Localized ttp of the proximal left thumb.  Mild STS of the thenar eminence.  No obvious ligament laxity, erythema, or bruising. No bony deformity, anatomical snuffbox is nontender. Cap refill is less than 2 seconds, distal sensation intact. Patient has full range of motion of the remaining fingers. Left wrist  is nontender.  Neurological: She is alert and oriented to person, place, and time. She exhibits normal muscle tone. Coordination normal.  Skin: Skin is warm and dry. No rash noted. No erythema.    ED Course  Procedures (including critical care time) Labs Review Labs Reviewed - No data to display  Imaging Review Dg Finger Thumb Left  02/14/2014   CLINICAL DATA:  Left thumb pain, trauma  EXAM: LEFT THUMB 2+V  COMPARISON:  None.  FINDINGS: There is no evidence of fracture or dislocation. There is no evidence of arthropathy or other focal bone abnormality. Soft tissues are unremarkable  IMPRESSION: Negative.   Electronically Signed   By: Christiana Pellant M.D.   On: 02/14/2014 10:22     EKG Interpretation None      MDM   Final diagnoses:  Sprain of left thumb, initial encounter    Patient agrees to elevate her hand. Minimal use. Pain improved after application of thumb spica splint. Remains neurovascularly intact. Rx for naprosyn, Referral information given for local orthopedics patient agrees to arrange followup.   Jaime Silbernagel L. Azalia Neuberger, PA-C 02/14/14 1052

## 2014-02-14 NOTE — ED Notes (Signed)
Pt reports was at work and a box of frozen food fell on left hand last week.  C/O pain to left thumb.

## 2014-02-14 NOTE — ED Provider Notes (Signed)
Medical screening examination/treatment/procedure(s) were conducted as a shared visit with non-physician practitioner(s) and myself.  I personally evaluated the patient during the encounter  Please see my separate respective documentation pertaining to this patient encounter   Vida Roller, MD 02/14/14 2244

## 2014-02-14 NOTE — Discharge Instructions (Signed)
Thumb Sprain °Your exam shows you have a sprained thumb. This means the ligaments around the joint have been torn. Thumb sprains usually take 3-6 weeks to heal. However, severe, unstable sprains may need to be fixed surgically. Sometimes a small piece of bone is pulled off by the ligament. If this is not treated properly, a sprained thumb can lead to a painful, weak joint. Treatment helps reduce pain and shortens the period of disability. °The thumb, and often the wrist, must remain splinted for the first 2-4 weeks to protect the joint. Keep your hand elevated and apply ice packs frequently to the injured area (20-30 minutes every 2-3 hours) for the next 2-4 days. This helps reduce swelling and control pain. Pain medicine may also be used for several days. Motion and strengthening exercises may later be prescribed for the joint to return to normal function. Be sure to see your doctor for follow-up because your thumb joint may require further support with splints, bandages or tape. Please see your doctor or go to the emergency room right away if you have increased pain despite proper treatment, or a numb, cold, or pale thumb. °Document Released: 07/05/2004 Document Revised: 08/20/2011 Document Reviewed: 05/29/2008 °[employer]® Patient Information ©2015 [employer]. This information is not intended to replace advice given to you by your health care provider. Make sure you discuss any questions you have with your health care provider. ° °

## 2014-02-14 NOTE — ED Provider Notes (Signed)
53 year old female presents after having a left hand injury that occurred several days ago. She reports that a heavy frozen food box fell onto her left thumb, she has had pain with mild swelling since that time. She does not want to flex her thumb secondary to pain. On exam the patient has no obvious swelling or asymmetry, she resists any flexion of the thumb but is able to extend and flex on her own free will. There is normal sensation to the fingertips, normal capillary refill and her x-rays are normal without signs of fracture. The patient can be sent home with Rice therapy, she started taking anti-inflammatories with improvement.  Medical screening examination/treatment/procedure(s) were conducted as a shared visit with non-physician practitioner(s) and myself.  I personally evaluated the patient during the encounter.  Clinical Impression:   Final diagnoses:  Sprain of left thumb, initial encounter     Vida Roller, MD 02/14/14 2244

## 2015-07-14 ENCOUNTER — Emergency Department (HOSPITAL_COMMUNITY): Payer: Medicaid - Out of State

## 2015-07-14 ENCOUNTER — Emergency Department (HOSPITAL_COMMUNITY)
Admission: EM | Admit: 2015-07-14 | Discharge: 2015-07-14 | Disposition: A | Discharge status: Home / Self Care | Payer: Medicaid - Out of State | Attending: Emergency Medicine | Admitting: Emergency Medicine

## 2015-07-14 ENCOUNTER — Encounter (HOSPITAL_COMMUNITY): Payer: Self-pay | Admitting: Emergency Medicine

## 2015-07-14 DIAGNOSIS — H9201 Otalgia, right ear: Secondary | ICD-10-CM | POA: Insufficient documentation

## 2015-07-14 DIAGNOSIS — E119 Type 2 diabetes mellitus without complications: Secondary | ICD-10-CM | POA: Insufficient documentation

## 2015-07-14 DIAGNOSIS — E039 Hypothyroidism, unspecified: Secondary | ICD-10-CM | POA: Diagnosis not present

## 2015-07-14 DIAGNOSIS — E7800 Pure hypercholesterolemia, unspecified: Secondary | ICD-10-CM | POA: Insufficient documentation

## 2015-07-14 DIAGNOSIS — H538 Other visual disturbances: Secondary | ICD-10-CM | POA: Diagnosis not present

## 2015-07-14 DIAGNOSIS — I10 Essential (primary) hypertension: Secondary | ICD-10-CM | POA: Diagnosis not present

## 2015-07-14 DIAGNOSIS — Z7984 Long term (current) use of oral hypoglycemic drugs: Secondary | ICD-10-CM | POA: Diagnosis not present

## 2015-07-14 DIAGNOSIS — H539 Unspecified visual disturbance: Secondary | ICD-10-CM

## 2015-07-14 DIAGNOSIS — Z79899 Other long term (current) drug therapy: Secondary | ICD-10-CM | POA: Diagnosis not present

## 2015-07-14 LAB — CBC WITH DIFFERENTIAL/PLATELET
BASOS PCT: 1 %
Basophils Absolute: 0.1 10*3/uL (ref 0.0–0.1)
EOS PCT: 2 %
Eosinophils Absolute: 0.2 10*3/uL (ref 0.0–0.7)
HEMATOCRIT: 38.8 % (ref 36.0–46.0)
HEMOGLOBIN: 12.3 g/dL (ref 12.0–15.0)
LYMPHS PCT: 37 %
Lymphs Abs: 2.8 10*3/uL (ref 0.7–4.0)
MCH: 29.9 pg (ref 26.0–34.0)
MCHC: 31.7 g/dL (ref 30.0–36.0)
MCV: 94.2 fL (ref 78.0–100.0)
Monocytes Absolute: 0.4 10*3/uL (ref 0.1–1.0)
Monocytes Relative: 5 %
Neutro Abs: 4.2 10*3/uL (ref 1.7–7.7)
Neutrophils Relative %: 55 %
Platelets: 300 10*3/uL (ref 150–400)
RBC: 4.12 MIL/uL (ref 3.87–5.11)
RDW: 14.4 % (ref 11.5–15.5)
WBC: 7.7 10*3/uL (ref 4.0–10.5)

## 2015-07-14 LAB — BASIC METABOLIC PANEL
ANION GAP: 9 (ref 5–15)
BUN: 18 mg/dL (ref 6–20)
CHLORIDE: 105 mmol/L (ref 101–111)
CO2: 28 mmol/L (ref 22–32)
Calcium: 9.3 mg/dL (ref 8.9–10.3)
Creatinine, Ser: 1.18 mg/dL — ABNORMAL HIGH (ref 0.44–1.00)
GFR calc Af Amer: 58 mL/min — ABNORMAL LOW (ref >60)
GFR calc non Af Amer: 50 mL/min — ABNORMAL LOW (ref >60)
GLUCOSE: 106 mg/dL — AB (ref 65–99)
POTASSIUM: 4 mmol/L (ref 3.5–5.1)
Sodium: 142 mmol/L (ref 135–145)

## 2015-07-14 MED ORDER — TRAMADOL HCL 50 MG PO TABS: 50.0000 mg | ORAL_TABLET | Freq: Four times a day (QID) | ORAL | Status: DC | PRN

## 2015-07-14 MED ORDER — TRAMADOL HCL 50 MG PO TABS
50.0000 mg | ORAL_TABLET | Freq: Once | ORAL | Status: AC
  Administered 2015-07-14: 50 mg via ORAL
  Filled 2015-07-14: qty 1

## 2015-07-14 NOTE — ED Notes (Signed)
Pt states understanding of care given and follow up instructions 

## 2015-07-14 NOTE — ED Provider Notes (Addendum)
CSN: [id number]     Arrival date & time 07/14/15  1728 History   First MD Initiated Contact with Patient 07/14/15 1912     Chief Complaint  Patient presents with  . Hypertension  . Spots and/or Floaters  . Otalgia     (Consider location/radiation/quality/duration/timing/severity/associated sxs/prior Treatment) Patient is a 55 y.o. female presenting with hypertension and ear pain. The history is provided by the patient.  Hypertension Pertinent negatives include no abdominal pain, no headaches and no shortness of breath.  Otalgia Associated symptoms: no abdominal pain, no congestion, no fever, no headaches, no rash, no sore throat and no vomiting    patient followed by primary care doctor and also has an eye doctor in the [place] area. Patient has a history of hypertension and diabetes. Patient with a two-week history of intermittent spots that change position in her eyes. Last week they were intermittent this week they have occurred every day but there is no visual field deficit no eye pain no flashes of light. No headache. Patient's blood pressure noted to be elevated here. Patient also with the complaint of right ear pain for the past 2 weeks. Patient has not had an upper respiratory infection. Ear pain is mild it is not severe. No fevers.  Past Medical History  Diagnosis Date  . Hypertension   . Thyroid disease   . Diabetes mellitus without complication (HCC)   . High cholesterol    Past Surgical History  Procedure Laterality Date  . Abdominal hysterectomy     History reviewed. No pertinent family history. Social History  Substance Use Topics  . Smoking status: Never Smoker   . Smokeless tobacco: None  . Alcohol Use: No   OB History    No data available     Review of Systems  Constitutional: Negative for fever.  HENT: Positive for ear pain. Negative for congestion, sinus pressure and sore throat.   Eyes: Positive for visual disturbance. Negative for photophobia, pain,  discharge and redness.  Respiratory: Negative for shortness of breath.   Gastrointestinal: Negative for nausea, vomiting and abdominal pain.  Genitourinary: Negative for dysuria.  Musculoskeletal: Negative for back pain.  Skin: Negative for rash.  Neurological: Negative for dizziness, weakness, numbness and headaches.  Hematological: Does not bruise/bleed easily.  Psychiatric/Behavioral: Negative for confusion.      Allergies  Aspirin  Home Medications   Prior to Admission medications   Medication Sig Start Date End Date Taking? Authorizing Provider  atorvastatin (LIPITOR) 40 MG tablet Take 40 mg by mouth at bedtime. 07/07/15  Yes Historical Provider, MD  carvedilol (COREG) 3.125 MG tablet Take 3.125 mg by mouth daily.   Yes Historical Provider, MD  D3-1000 1000 units capsule Take 1,000 Units by mouth daily. 07/07/15  Yes Historical Provider, MD  levothyroxine (SYNTHROID, LEVOTHROID) 137 MCG tablet Take 137 mcg by mouth daily before breakfast.   Yes Historical Provider, MD  lisinopril (PRINIVIL,ZESTRIL) 40 MG tablet Take 40 mg by mouth daily. 06/29/15  Yes Historical Provider, MD  metFORMIN (GLUCOPHAGE) 500 MG tablet Take 500 mg by mouth 2 (two) times daily.   Yes Historical Provider, MD  esomeprazole (NEXIUM) 40 MG capsule Take 40 mg by mouth daily as needed (acid reflux).     Historical Provider, MD  naproxen (NAPROSYN) 500 MG tablet Take 1 tablet (500 mg total) by mouth 2 (two) times daily with a meal. Patient not taking: Reported on 07/14/2015 02/14/14   Tammy Triplett, PA-C  naproxen sodium (ALEVE) 220 MG  tablet Take 440 mg by mouth 2 (two) times daily as needed (Pain).    Historical Provider, MD  traMADol (ULTRAM) 50 MG tablet Take 1 tablet (50 mg total) by mouth every 6 (six) hours as needed. 07/14/15   Vanetta Mulders, MD   BP 162/92 mmHg  Pulse 78  Temp(Src) 98.1 F (36.7 C) (Oral)  Resp 19  Ht 5' 5.5" (1.664 m)  Wt 117.935 kg  BMI 42.59 kg/m2  SpO2 97% Physical Exam   Constitutional: She is oriented to person, place, and time. She appears well-developed and well-nourished. No distress.  HENT:  Head: Normocephalic and atraumatic.  Left Ear: External ear normal.  Mouth/Throat: Oropharynx is clear and moist.  Right TM with slight erythema no bulging.  Eyes: Conjunctivae and EOM are normal. Pupils are equal, round, and reactive to light. Right eye exhibits no discharge. Left eye exhibits no discharge. No scleral icterus.  Neck: Normal range of motion. Neck supple.  Cardiovascular: Normal rate, regular rhythm and normal heart sounds.   No murmur heard. Pulmonary/Chest: Effort normal and breath sounds normal. No respiratory distress.  Abdominal: Soft. Bowel sounds are normal. There is no tenderness.  Musculoskeletal: Normal range of motion. She exhibits no edema.  Neurological: She is alert and oriented to person, place, and time. No cranial nerve deficit. She exhibits normal muscle tone. Coordination normal.  Skin: Skin is warm. No rash noted. No erythema.  Nursing note and vitals reviewed.   ED Course  Procedures (including critical care time) Labs Review Labs Reviewed  BASIC METABOLIC PANEL - Abnormal; Notable for the following:    Glucose, Bld 106 (*)    Creatinine, Ser 1.18 (*)    GFR calc non Af Amer 50 (*)    GFR calc Af Amer 58 (*)    All other components within normal limits  CBC WITH DIFFERENTIAL/PLATELET   Results for orders placed or performed during the hospital encounter of 07/14/15  Basic metabolic panel  Result Value Ref Range   Sodium 142 135 - 145 mmol/L   Potassium 4.0 3.5 - 5.1 mmol/L   Chloride 105 101 - 111 mmol/L   CO2 28 22 - 32 mmol/L   Glucose, Bld 106 (H) 65 - 99 mg/dL   BUN 18 6 - 20 mg/dL   Creatinine, Ser 1.61 (H) 0.44 - 1.00 mg/dL   Calcium 9.3 8.9 - 09.6 mg/dL   GFR calc non Af Amer 50 (L) >60 mL/min   GFR calc Af Amer 58 (L) >60 mL/min   Anion gap 9 5 - 15  CBC with Differential/Platelet  Result Value Ref  Range   WBC 7.7 4.0 - 10.5 K/uL   RBC 4.12 3.87 - 5.11 MIL/uL   Hemoglobin 12.3 12.0 - 15.0 g/dL   HCT 04.5 40.9 - 81.1 %   MCV 94.2 78.0 - 100.0 fL   MCH 29.9 26.0 - 34.0 pg   MCHC 31.7 30.0 - 36.0 g/dL   RDW 91.4 78.2 - 95.6 %   Platelets 300 150 - 400 K/uL   Neutrophils Relative % 55 %   Lymphocytes Relative 37 %   Monocytes Relative 5 %   Eosinophils Relative 2 %   Basophils Relative 1 %   Neutro Abs 4.2 1.7 - 7.7 K/uL   Lymphs Abs 2.8 0.7 - 4.0 K/uL   Monocytes Absolute 0.4 0.1 - 1.0 K/uL   Eosinophils Absolute 0.2 0.0 - 0.7 K/uL   Basophils Absolute 0.1 0.0 - 0.1 K/uL   RBC  Morphology STOMATOCYTES    WBC Morphology ATYPICAL LYMPHOCYTES      Imaging Review Ct Head Wo Contrast  07/14/2015  CLINICAL DATA:  Right ear pain with floaters in her vision for 1 week. EXAM: CT HEAD WITHOUT CONTRAST TECHNIQUE: Contiguous axial images were obtained from the base of the skull through the vertex without intravenous contrast. COMPARISON:  None. FINDINGS: There is no midline shift, hydrocephalus, or mass. No acute hemorrhage or acute transcortical infarct is identified. The bony calvarium is intact. The visualized sinuses are clear. There is mild chronic diffuse atrophy. IMPRESSION: No focal acute intracranial abnormality identified. Mild chronic diffuse atrophy. Electronically Signed   By: Sherian Rein M.D.   On: 07/14/2015 21:24   I have personally reviewed and evaluated these images and lab results as part of my medical decision-making.   EKG Interpretation None      MDM   Final diagnoses:  Essential hypertension  Visual changes   Patient with a history of hypertension and diabetes. Patient is followed by primary care doctor and does have an eye physician in the [place] area. Patient presents here with a two-week history of visual changes that seems to be consistent with intermittent floaters. The floaters have been increasing over the past 7 days. No headache. No fevers no upper  respiratory symptoms. However patient does have some right ear pain. On exam there is little bit of slight erythema to the right ear but no bulging not consistent with a full blown otitis media. Left ear is normal. Head CT is negative. Blood pressure improved here some most recently 162/92 still elevated. Patient is on hypertensive meds and did not run out of them. Patient's CBC is normal. Basic metabolic panel is pending. But should be back shortly. If with out significant changes patient will be discharged home treated for the ear pain but not with antibiotics would recommend follow-up with her primary care doctor for follow-up of her blood pressure. And also follow-up with her eye doctor in the [place] area in the next few days. Patient will return for any new or worse symptoms. She is nontoxic no acute distress.  Patient is not having any floaters at this point in time during her visit here. Also without any permanent visual changes. No eye pain.   BASIC metabolic panel without any significant abnormalities. Stable for discharge home.     Vanetta Mulders, MD 07/14/15 1610  Vanetta Mulders, MD 07/14/15 2212

## 2015-07-14 NOTE — ED Notes (Signed)
Pt states that for the past week her right ear has been hurting and she has been seeing intermittent floaters in her vision.  Denies dizziness.

## 2015-07-14 NOTE — Discharge Instructions (Signed)
Take the tramadol as needed for the ear pain. Negative limited to follow-up with your eye doctor in [place]. Make appointment with your regular doctor to follow-up your blood pressure. Return for any new or worse symptoms. Head CT was negative.

## 2015-10-13 ENCOUNTER — Emergency Department (HOSPITAL_COMMUNITY)
Admission: EM | Admit: 2015-10-13 | Discharge: 2015-10-13 | Disposition: A | Discharge status: Home / Self Care | Payer: Medicaid - Out of State | Attending: Emergency Medicine | Admitting: Emergency Medicine

## 2015-10-13 ENCOUNTER — Encounter (HOSPITAL_COMMUNITY): Payer: Self-pay | Admitting: Emergency Medicine

## 2015-10-13 DIAGNOSIS — I10 Essential (primary) hypertension: Secondary | ICD-10-CM | POA: Diagnosis not present

## 2015-10-13 DIAGNOSIS — E119 Type 2 diabetes mellitus without complications: Secondary | ICD-10-CM | POA: Insufficient documentation

## 2015-10-13 DIAGNOSIS — G8929 Other chronic pain: Secondary | ICD-10-CM | POA: Diagnosis not present

## 2015-10-13 DIAGNOSIS — J029 Acute pharyngitis, unspecified: Secondary | ICD-10-CM | POA: Insufficient documentation

## 2015-10-13 DIAGNOSIS — Z79899 Other long term (current) drug therapy: Secondary | ICD-10-CM | POA: Diagnosis not present

## 2015-10-13 DIAGNOSIS — Z7984 Long term (current) use of oral hypoglycemic drugs: Secondary | ICD-10-CM | POA: Insufficient documentation

## 2015-10-13 DIAGNOSIS — R109 Unspecified abdominal pain: Secondary | ICD-10-CM | POA: Insufficient documentation

## 2015-10-13 LAB — RAPID STREP SCREEN (MED CTR MEBANE ONLY): STREPTOCOCCUS, GROUP A SCREEN (DIRECT): NEGATIVE

## 2015-10-13 MED ORDER — LORATADINE 10 MG PO TABS: 10.0000 mg | ORAL_TABLET | Freq: Every day | ORAL | Status: DC

## 2015-10-13 NOTE — ED Notes (Signed)
Pt c/o scratchy sore for last 2 days.rates discomfort 8/10.

## 2015-10-13 NOTE — ED Provider Notes (Signed)
CSN: [id number]     Arrival date & time 10/13/15  0747 History   First MD Initiated Contact with Patient 10/13/15 0757     Chief Complaint  Patient presents with  . Sore Throat     Patient is a 55 y.o. female presenting with pharyngitis. The history is provided by the patient.  Sore Throat This is a new problem. The current episode started 2 days ago. The problem occurs daily. The problem has been gradually worsening. Pertinent negatives include no headaches. The symptoms are aggravated by swallowing. The symptoms are relieved by rest.  Pt reports "scratchy throat" for 2 days It seems worse at night She reports post nasal drip No fever/vomiting No drooling No HA No other new pain complaints She had not taken any medications for this illness   Past Medical History  Diagnosis Date  . Hypertension   . Thyroid disease   . Diabetes mellitus without complication (HCC)   . High cholesterol    Past Surgical History  Procedure Laterality Date  . Abdominal hysterectomy     History reviewed. No pertinent family history. Social History  Substance Use Topics  . Smoking status: Never Smoker   . Smokeless tobacco: None  . Alcohol Use: No   OB History    No data available     Review of Systems  Constitutional: Negative for fever.  HENT: Positive for sore throat. Negative for drooling.   Respiratory: Negative for cough.   Gastrointestinal:       Chronic abdominal pain   Musculoskeletal: Negative for back pain.  Neurological: Negative for headaches.  All other systems reviewed and are negative.     Allergies  Aspirin  Home Medications   Prior to Admission medications   Medication Sig Start Date End Date Taking? Authorizing Provider  atorvastatin (LIPITOR) 40 MG tablet Take 40 mg by mouth at bedtime. 07/07/15   Historical Provider, MD  carvedilol (COREG) 3.125 MG tablet Take 3.125 mg by mouth daily.    Historical Provider, MD  D3-1000 1000 units capsule Take 1,000 Units  by mouth daily. 07/07/15   Historical Provider, MD  esomeprazole (NEXIUM) 40 MG capsule Take 40 mg by mouth daily as needed (acid reflux).     Historical Provider, MD  levothyroxine (SYNTHROID, LEVOTHROID) 137 MCG tablet Take 137 mcg by mouth daily before breakfast.    Historical Provider, MD  lisinopril (PRINIVIL,ZESTRIL) 40 MG tablet Take 40 mg by mouth daily. 06/29/15   Historical Provider, MD  metFORMIN (GLUCOPHAGE) 500 MG tablet Take 500 mg by mouth 2 (two) times daily.    Historical Provider, MD  naproxen (NAPROSYN) 500 MG tablet Take 1 tablet (500 mg total) by mouth 2 (two) times daily with a meal. Patient not taking: Reported on 07/14/2015 02/14/14   Tammy Triplett, PA-C  naproxen sodium (ALEVE) 220 MG tablet Take 440 mg by mouth 2 (two) times daily as needed (Pain).    Historical Provider, MD  traMADol (ULTRAM) 50 MG tablet Take 1 tablet (50 mg total) by mouth every 6 (six) hours as needed. 07/14/15   Vanetta Mulders, MD   BP 181/94 mmHg  Pulse 68  Temp(Src) 97.9 F (36.6 C) (Oral)  Resp 16  Ht  (1.676 m)  Wt 113.399 kg  BMI 40.37 kg/m2  SpO2 98% Physical Exam CONSTITUTIONAL: Well developed/well nourished HEAD: Normocephalic/atraumatic EYES: EOMI ENMT: Mucous membranes moist, uvula midline, mild erythema noted, no exudates noted, no drooling and no stridor NECK: supple no meningeal signs SPINE/BACK:entire  spine nontender CV: S1/S2 noted, no murmurs/rubs/gallops noted LUNGS: Lungs are clear to auscultation bilaterally, no apparent distress ABDOMEN: soft, nontender NEURO: Pt is awake/alert/appropriate, moves all extremitiesx4.  No facial droop.   EXTREMITIES:  full ROM SKIN: warm, color normal PSYCH: no abnormalities of mood noted, alert and oriented to situation  ED Course  Procedures  Labs Review Labs Reviewed  RAPID STREP SCREEN (NOT AT [facility])    I have personally reviewed and evaluated these  lab results as part of my medical decision-making.    MDM   Final  diagnoses:  Pharyngitis    Nursing notes including past medical history and social history reviewed and considered in documentation Labs/vital reviewed myself and considered during evaluation     Zadie Rhineonald Wilkins Elpers, MD 10/13/15 [phone]

## 2015-10-15 LAB — CULTURE, GROUP A STREP (THRC)

## 2016-04-04 ENCOUNTER — Emergency Department (HOSPITAL_COMMUNITY): Payer: Medicaid - Out of State

## 2016-04-04 ENCOUNTER — Encounter (HOSPITAL_COMMUNITY): Payer: Self-pay

## 2016-04-04 ENCOUNTER — Emergency Department (HOSPITAL_COMMUNITY)
Admission: EM | Admit: 2016-04-04 | Discharge: 2016-04-04 | Disposition: A | Discharge status: Home / Self Care | Payer: Medicaid - Out of State | Attending: Emergency Medicine | Admitting: Emergency Medicine

## 2016-04-04 DIAGNOSIS — Z7984 Long term (current) use of oral hypoglycemic drugs: Secondary | ICD-10-CM | POA: Insufficient documentation

## 2016-04-04 DIAGNOSIS — E119 Type 2 diabetes mellitus without complications: Secondary | ICD-10-CM | POA: Insufficient documentation

## 2016-04-04 DIAGNOSIS — I10 Essential (primary) hypertension: Secondary | ICD-10-CM | POA: Insufficient documentation

## 2016-04-04 DIAGNOSIS — R1032 Left lower quadrant pain: Secondary | ICD-10-CM | POA: Insufficient documentation

## 2016-04-04 DIAGNOSIS — R109 Unspecified abdominal pain: Secondary | ICD-10-CM

## 2016-04-04 DIAGNOSIS — Z79899 Other long term (current) drug therapy: Secondary | ICD-10-CM | POA: Insufficient documentation

## 2016-04-04 LAB — CBC WITH DIFFERENTIAL/PLATELET
BASOS ABS: 0 10*3/uL (ref 0.0–0.1)
BASOS PCT: 0 %
Eosinophils Absolute: 0.1 10*3/uL (ref 0.0–0.7)
Eosinophils Relative: 1 %
HEMATOCRIT: 39.9 % (ref 36.0–46.0)
HEMOGLOBIN: 12.6 g/dL (ref 12.0–15.0)
LYMPHS PCT: 36 %
Lymphs Abs: 2.4 10*3/uL (ref 0.7–4.0)
MCH: 30.1 pg (ref 26.0–34.0)
MCHC: 31.6 g/dL (ref 30.0–36.0)
MCV: 95.5 fL (ref 78.0–100.0)
Monocytes Absolute: 0.3 10*3/uL (ref 0.1–1.0)
Monocytes Relative: 5 %
NEUTROS ABS: 3.9 10*3/uL (ref 1.7–7.7)
NEUTROS PCT: 58 %
Platelets: 305 10*3/uL (ref 150–400)
RBC: 4.18 MIL/uL (ref 3.87–5.11)
RDW: 14.5 % (ref 11.5–15.5)
WBC: 6.7 10*3/uL (ref 4.0–10.5)

## 2016-04-04 LAB — URINE MICROSCOPIC-ADD ON
BACTERIA UA: NONE SEEN
Squamous Epithelial / LPF: NONE SEEN
WBC, UA: NONE SEEN WBC/hpf (ref 0–5)

## 2016-04-04 LAB — URINALYSIS, ROUTINE W REFLEX MICROSCOPIC
Bilirubin Urine: NEGATIVE
GLUCOSE, UA: NEGATIVE mg/dL
KETONES UR: NEGATIVE mg/dL
Leukocytes, UA: NEGATIVE
Nitrite: NEGATIVE
PH: 7 (ref 5.0–8.0)
Protein, ur: NEGATIVE mg/dL
Specific Gravity, Urine: 1.015 (ref 1.005–1.030)

## 2016-04-04 LAB — COMPREHENSIVE METABOLIC PANEL
ALBUMIN: 3.7 g/dL (ref 3.5–5.0)
ALK PHOS: 69 U/L (ref 38–126)
ALT: 18 U/L (ref 14–54)
AST: 22 U/L (ref 15–41)
Anion gap: 7 (ref 5–15)
BUN: 17 mg/dL (ref 6–20)
CO2: 32 mmol/L (ref 22–32)
Calcium: 9.1 mg/dL (ref 8.9–10.3)
Chloride: 99 mmol/L — ABNORMAL LOW (ref 101–111)
Creatinine, Ser: 1.32 mg/dL — ABNORMAL HIGH (ref 0.44–1.00)
GFR calc Af Amer: 51 mL/min — ABNORMAL LOW (ref >60)
GFR calc non Af Amer: 44 mL/min — ABNORMAL LOW (ref >60)
Glucose, Bld: 107 mg/dL — ABNORMAL HIGH (ref 65–99)
POTASSIUM: 3.7 mmol/L (ref 3.5–5.1)
Sodium: 138 mmol/L (ref 135–145)
Total Bilirubin: 0.4 mg/dL (ref 0.3–1.2)
Total Protein: 7.4 g/dL (ref 6.5–8.1)

## 2016-04-04 LAB — LIPASE, BLOOD: Lipase: 22 U/L (ref 11–51)

## 2016-04-04 MED ORDER — IOPAMIDOL (ISOVUE-300) INJECTION 61%
INTRAVENOUS | Status: AC
  Administered 2016-04-04: 30 mL
  Filled 2016-04-04: qty 30

## 2016-04-04 MED ORDER — HYDROCODONE-ACETAMINOPHEN 5-325 MG PO TABS: ORAL_TABLET | ORAL | 0 refills | Status: DC

## 2016-04-04 MED ORDER — IOPAMIDOL (ISOVUE-300) INJECTION 61%
80.0000 mL | Freq: Once | INTRAVENOUS | Status: AC | PRN
  Administered 2016-04-04: 80 mL via INTRAVENOUS

## 2016-04-04 MED ORDER — CYCLOBENZAPRINE HCL 10 MG PO TABS: 10.0000 mg | ORAL_TABLET | Freq: Three times a day (TID) | ORAL | 0 refills | Status: DC | PRN

## 2016-04-04 NOTE — ED Notes (Signed)
Completed oral contrast. 

## 2016-04-04 NOTE — ED Triage Notes (Signed)
PT reports lower abd pain x 3 months.  Reports pain is worse in llq.  Denies n/v/d.  LBM was this morning and was normal per pt.  Denies any urinary symptoms, vaginal bleeding, or discharge.

## 2016-04-04 NOTE — ED Provider Notes (Signed)
[facility]-EMERGENCY DEPT Provider Note   CSN: [id number] Arrival date & time: 04/04/16  0857     History   Chief Complaint Chief Complaint  Patient presents with  . Abdominal Pain    HPI Jaime Jordan is a 55 y.o. female.  HPI   Jaime Jordan is a 55 y.o. female who presents to the Emergency Department complaining of generalized lower abdominal pain for 3 months.  Pain is constant and worse to the left lower abdomen.  She states the pain is not associated with movement, food, diarrhea or vomiting or dysuria.  Nothing makes it better or worse.  She had a colonoscopy earlier this year in [place], [place] with two benign polyps found.  She denies fever, bloody or black stools.  She has tried OTC anti-acid medications without relief.  Surgical hx includes abdominal hysterectomy   Past Medical History:  Diagnosis Date  . Diabetes mellitus without complication (HCC)   . High cholesterol   . Hypertension   . Thyroid disease     There are no active problems to display for this patient.   Past Surgical History:  Procedure Laterality Date  . ABDOMINAL HYSTERECTOMY      OB History    No data available       Home Medications    Prior to Admission medications   Medication Sig Start Date End Date Taking? Authorizing Provider  atorvastatin (LIPITOR) 40 MG tablet Take 40 mg by mouth at bedtime. 07/07/15   Historical Provider, MD  carvedilol (COREG) 3.125 MG tablet Take 3.125 mg by mouth daily.    Historical Provider, MD  D3-1000 1000 units capsule Take 1,000 Units by mouth daily. 07/07/15   Historical Provider, MD  esomeprazole (NEXIUM) 40 MG capsule Take 40 mg by mouth daily as needed (acid reflux).     Historical Provider, MD  levothyroxine (SYNTHROID, LEVOTHROID) 137 MCG tablet Take 137 mcg by mouth daily before breakfast.    Historical Provider, MD  lisinopril (PRINIVIL,ZESTRIL) 40 MG tablet Take 40 mg by mouth daily. 06/29/15   Historical Provider, MD  loratadine (CLARITIN) 10 MG  tablet Take 1 tablet (10 mg total) by mouth daily. 10/13/15   Zadie Rhine, MD  metFORMIN (GLUCOPHAGE) 500 MG tablet Take 500 mg by mouth 2 (two) times daily.    Historical Provider, MD  naproxen sodium (ALEVE) 220 MG tablet Take 440 mg by mouth 2 (two) times daily as needed (Pain).    Historical Provider, MD    Family History No family history on file.  Social History Social History  Substance Use Topics  . Smoking status: Never Smoker  . Smokeless tobacco: Never Used  . Alcohol use No     Allergies   Aspirin   Review of Systems Review of Systems  Constitutional: Negative for appetite change, chills and fever.  Respiratory: Negative for shortness of breath.   Cardiovascular: Negative for chest pain.  Gastrointestinal: Positive for abdominal pain. Negative for abdominal distention, blood in stool, constipation, diarrhea, nausea and vomiting.  Genitourinary: Negative for decreased urine volume, difficulty urinating, dysuria, flank pain and vaginal bleeding.  Musculoskeletal: Negative for back pain.  Skin: Negative for color change and rash.  Neurological: Negative for dizziness, weakness and numbness.  Hematological: Negative for adenopathy.  All other systems reviewed and are negative.    Physical Exam Updated Vital Signs BP 170/95 (BP Location: Left Arm)   Pulse 65   Temp 98.3 F (36.8 C) (Oral)   Resp 20   Ht 5'  6" (1.676 m)   Wt 120.2 kg   SpO2 100%   BMI 42.77 kg/m   Physical Exam  Constitutional: She is oriented to person, place, and time. She appears well-developed and well-nourished. No distress.  HENT:  Head: Normocephalic and atraumatic.  Mouth/Throat: Oropharynx is clear and moist.  Cardiovascular: Normal rate, regular rhythm, normal heart sounds and intact distal pulses.   No murmur heard. Pulmonary/Chest: Effort normal and breath sounds normal. No respiratory distress.  Abdominal: Soft. Normal appearance and bowel sounds are normal. She exhibits  no distension and no mass. There is tenderness in the left lower quadrant. There is no rebound, no guarding, no CVA tenderness and no tenderness at McBurney's point.    Musculoskeletal: Normal range of motion. She exhibits no edema.  Neurological: She is alert and oriented to person, place, and time. She exhibits normal muscle tone. Coordination normal.  Skin: Skin is warm and dry.  Nursing note and vitals reviewed.    ED Treatments / Results  Labs (all labs ordered are listed, but only abnormal results are displayed) Labs Reviewed  COMPREHENSIVE METABOLIC PANEL - Abnormal; Notable for the following:       Result Value   Chloride 99 (*)    Glucose, Bld 107 (*)    Creatinine, Ser 1.32 (*)    GFR calc non Af Amer 44 (*)    GFR calc Af Amer 51 (*)    All other components within normal limits  URINALYSIS, ROUTINE W REFLEX MICROSCOPIC (NOT AT [facility]) - Abnormal; Notable for the following:    Hgb urine dipstick TRACE (*)    All other components within normal limits  CBC WITH DIFFERENTIAL/PLATELET  LIPASE, BLOOD  URINE MICROSCOPIC-ADD ON    EKG  EKG Interpretation None       Radiology Ct Abdomen Pelvis W Contrast  Result Date: 04/04/2016 CLINICAL DATA:  LEFT lower quadrant pain for 3 months. EXAM: CT ABDOMEN AND PELVIS WITH CONTRAST TECHNIQUE: Multidetector CT imaging of the abdomen and pelvis was performed using the standard protocol following bolus administration of intravenous contrast. CONTRAST:  30mL ISOVUE-300 IOPAMIDOL (ISOVUE-300) INJECTION 61%, 80mL ISOVUE-300 IOPAMIDOL (ISOVUE-300) INJECTION 61% COMPARISON:  None. FINDINGS: Lower chest: Lung bases are clear. Hepatobiliary: No focal hepatic lesion. No biliary duct dilatation. Gallbladder is normal. Common bile duct is normal. Pancreas: Pancreas is normal. No ductal dilatation. No pancreatic inflammation. Spleen: Normal spleen Adrenals/urinary tract: Adrenal glands and kidneys are normal. The ureters and bladder normal.  Stomach/Bowel: Stomach, small bowel, appendix, and cecum are normal. Colon rectosigmoid colon are normal. Vascular/Lymphatic: Abdominal aorta is normal caliber with atherosclerotic calcification. There is no retroperitoneal or periportal lymphadenopathy. No pelvic lymphadenopathy. Reproductive: Post hysterectomy anatomy. Other: No free fluid. No inguinal hernia or ventral hernia. Prior abdominal wall surgery without complication Musculoskeletal: No aggressive osseous lesion. IMPRESSION: 1. No explanation for LEFT lower quadrant pain. 2. No diverticulitis. 3. No abdominal wall hernia. Electronically Signed   By: Genevive Bi M.D.   On: 04/04/2016 13:01     Procedures Procedures (including critical care time)  Medications Ordered in ED Medications  iopamidol (ISOVUE-300) 61 % injection (not administered)     Initial Impression / Assessment and Plan / ED Course  I have reviewed the triage vital signs and the nursing notes.  Pertinent labs & imaging results that were available during my care of the patient were reviewed by me and considered in my medical decision making (see chart for details).  Clinical Course   Pt well  appearing.  Labs and CT abd/pelvis reassuring.  symptoms are chronic and present for 3 months.  No red flags on exam.  She appears stable for d/c and referral info given and discussed need for GI follow-up. Pt agrees to plan   The patient appears reasonably screened and/or stabilized for discharge and I doubt any other medical condition or other [facility] requiring further screening, evaluation, or treatment in the ED at this time prior to discharge.   Final Clinical Impressions(s) / ED Diagnoses   Final diagnoses:  Abdominal pain, unspecified abdominal location    New Prescriptions New Prescriptions   No medications on file     Pauline Ausammy Chad Tiznado, PA-C 04/08/16 1517    Lavera Guiseana Duo Liu, MD 04/10/16 [phone]

## 2016-04-04 NOTE — Discharge Instructions (Signed)
You pain may be related to your hip.  Try applying heat on/off to your hip.  Follow-up with your primary doctor for recheck

## 2016-08-02 ENCOUNTER — Encounter (HOSPITAL_COMMUNITY): Payer: Self-pay

## 2016-08-02 ENCOUNTER — Emergency Department (HOSPITAL_COMMUNITY)
Admission: EM | Admit: 2016-08-02 | Discharge: 2016-08-02 | Disposition: A | Discharge status: Home / Self Care | Payer: Medicaid - Out of State | Attending: Emergency Medicine | Admitting: Emergency Medicine

## 2016-08-02 DIAGNOSIS — Z7984 Long term (current) use of oral hypoglycemic drugs: Secondary | ICD-10-CM | POA: Insufficient documentation

## 2016-08-02 DIAGNOSIS — E119 Type 2 diabetes mellitus without complications: Secondary | ICD-10-CM | POA: Insufficient documentation

## 2016-08-02 DIAGNOSIS — I10 Essential (primary) hypertension: Secondary | ICD-10-CM | POA: Insufficient documentation

## 2016-08-02 DIAGNOSIS — H9201 Otalgia, right ear: Secondary | ICD-10-CM

## 2016-08-02 DIAGNOSIS — Z79899 Other long term (current) drug therapy: Secondary | ICD-10-CM | POA: Insufficient documentation

## 2016-08-02 MED ORDER — ANTIPYRINE-BENZOCAINE 5.4-1.4 % OT SOLN: 3.0000 [drp] | OTIC | 0 refills | Status: AC | PRN

## 2016-08-02 NOTE — ED Provider Notes (Signed)
[facility]-EMERGENCY DEPT Provider Note   CSN: [id number] Arrival date & time: 08/02/16  1418     History   Chief Complaint Chief Complaint  Patient presents with  . Otalgia    HPI Jaime Jordan is a 56 y.o. female presenting with right ear pain which has been intermittently present now for months.  She describes an aching pain that returned yesterday and has been persistent. Was seen by pcp last year for similar sx and was prescribed a medicine drop (does not know the name) but it did not relieve her symptoms.  She denies fevers, chills, drainage from the ear, no swelling, tinnitus or decreased hearing acuity.  Also denies headache or dizziness.Marland Kitchen.  HPI  Past Medical History:  Diagnosis Date  . Diabetes mellitus without complication (HCC)   . High cholesterol   . Hypertension   . Thyroid disease     There are no active problems to display for this patient.   Past Surgical History:  Procedure Laterality Date  . ABDOMINAL HYSTERECTOMY      OB History    No data available       Home Medications    Prior to Admission medications   Medication Sig Start Date End Date Taking? Authorizing Provider  atorvastatin (LIPITOR) 40 MG tablet Take 40 mg by mouth at bedtime. 07/07/15  Yes Historical Provider, MD  carvedilol (COREG) 3.125 MG tablet Take 3.125 mg by mouth daily.   Yes Historical Provider, MD  cyclobenzaprine (FLEXERIL) 10 MG tablet Take 1 tablet (10 mg total) by mouth 3 (three) times daily as needed. 04/04/16  Yes Tammy Triplett, PA-C  D3-1000 1000 units capsule Take 1,000 Units by mouth daily. 07/07/15  Yes Historical Provider, MD  esomeprazole (NEXIUM) 40 MG capsule Take 40 mg by mouth daily as needed (acid reflux).    Yes Historical Provider, MD  hydrochlorothiazide (HYDRODIURIL) 25 MG tablet Take 25 mg by mouth daily.   Yes Historical Provider, MD  HYDROcodone-acetaminophen (NORCO/VICODIN) 5-325 MG tablet Take one-two tabs po q 4-6 hrs prn pain 04/04/16  Yes Tammy Triplett,  PA-C  levothyroxine (SYNTHROID, LEVOTHROID) 137 MCG tablet Take 137 mcg by mouth daily before breakfast.   Yes Historical Provider, MD  lisinopril (PRINIVIL,ZESTRIL) 40 MG tablet Take 40 mg by mouth daily. 06/29/15  Yes Historical Provider, MD  metFORMIN (GLUCOPHAGE) 500 MG tablet Take 500 mg by mouth 2 (two) times daily.   Yes Historical Provider, MD  antipyrine-benzocaine Lyla Son(AURALGAN) otic solution Place 3-4 drops into the right ear every 2 (two) hours as needed for ear pain. Compounded solution of 1% hydrocortisone and 2% benzocaine compounded solution. 08/02/16   Burgess AmorJulie Deklynn Charlet, PA-C    Family History No family history on file.  Social History Social History  Substance Use Topics  . Smoking status: Never Smoker  . Smokeless tobacco: Never Used  . Alcohol use No     Allergies   Aspirin   Review of Systems Review of Systems  Constitutional: Negative for chills and fever.  HENT: Positive for ear pain. Negative for congestion, ear discharge, facial swelling, hearing loss, rhinorrhea, sinus pressure, sore throat, trouble swallowing and voice change.   Eyes: Negative for discharge.  Respiratory: Negative for cough, shortness of breath, wheezing and stridor.   Cardiovascular: Negative for chest pain.  Gastrointestinal: Negative for abdominal pain.  Genitourinary: Negative.      Physical Exam Updated Vital Signs BP 153/88 (BP Location: Left Arm)   Pulse 68   Temp 98.5 F (36.9 C) (Oral)  Resp 16   Ht 5\' 5"  (1.651 m)   Wt 95.3 kg   SpO2 99%   BMI 34.95 kg/m   Physical Exam  Constitutional: She is oriented to person, place, and time. She appears well-developed and well-nourished.  HENT:  Head: Normocephalic and atraumatic.  Right Ear: Hearing, tympanic membrane, external ear and ear canal normal. No swelling or tenderness. No mastoid tenderness. Tympanic membrane is not injected, not perforated, not retracted and not bulging. No middle ear effusion. No decreased hearing is  noted.  Left Ear: Hearing, tympanic membrane, external ear and ear canal normal. No swelling or tenderness. No mastoid tenderness. Tympanic membrane is not injected, not perforated, not retracted and not bulging.  No middle ear effusion. No decreased hearing is noted.  Nose: Mucosal edema and rhinorrhea present.  Mouth/Throat: Uvula is midline, oropharynx is clear and moist and mucous membranes are normal. No oropharyngeal exudate, posterior oropharyngeal edema, posterior oropharyngeal erythema or tonsillar abscesses.  Eyes: Conjunctivae are normal.  Cardiovascular: Normal rate.   Pulmonary/Chest: Effort normal.  Musculoskeletal: Normal range of motion.  Neurological: She is alert and oriented to person, place, and time.  Skin: Skin is warm and dry. No rash noted.  Psychiatric: She has a normal mood and affect.     ED Treatments / Results  Labs (all labs ordered are listed, but only abnormal results are displayed) Labs Reviewed - No data to display  EKG  EKG Interpretation None       Radiology No results found.  Procedures Procedures (including critical care time)  Medications Ordered in ED Medications - No data to display   Initial Impression / Assessment and Plan / ED Course  I have reviewed the triage vital signs and the nursing notes.  Pertinent labs & imaging results that were available during my care of the patient were reviewed by me and considered in my medical decision making (see chart for details).     Pt with right ear pain, chronic and intermittent without neuro or hearing deficit.  Exam normal.  No sx to suggest Meniere's disease, no dizziness.  No findings to suggest chronic mastoiditis.  She was prescribed pain reliever, advised f/u with pcp and may need referral to ENT if sx persist.    The patient appears reasonably screened and/or stabilized for discharge and I doubt any other medical condition or other [facility] requiring further screening, evaluation, or  treatment in the ED at this time prior to discharge.   Final Clinical Impressions(s) / ED Diagnoses   Final diagnoses:  Otalgia of right ear    New Prescriptions New Prescriptions   ANTIPYRINE-BENZOCAINE (AURALGAN) OTIC SOLUTION    Place 3-4 drops into the right ear every 2 (two) hours as needed for ear pain. Compounded solution of 1% hydrocortisone and 2% benzocaine compounded solution.     Burgess Amor, PA-C 08/03/16 1328    Bethann Berkshire, MD 08/04/16 [phone]

## 2016-08-02 NOTE — ED Triage Notes (Signed)
Right ear pain for "months". Saw a doctor last year for complaint. States pain Is getting worse.

## 2017-06-14 ENCOUNTER — Other Ambulatory Visit: Payer: Self-pay

## 2017-06-14 ENCOUNTER — Encounter (HOSPITAL_COMMUNITY): Payer: Self-pay | Admitting: Emergency Medicine

## 2017-06-14 ENCOUNTER — Emergency Department (HOSPITAL_COMMUNITY)
Admission: EM | Admit: 2017-06-14 | Discharge: 2017-06-14 | Disposition: A | Discharge status: Home / Self Care | Payer: Medicare Other | Attending: Emergency Medicine | Admitting: Emergency Medicine

## 2017-06-14 ENCOUNTER — Emergency Department (HOSPITAL_COMMUNITY): Payer: Medicare Other

## 2017-06-14 DIAGNOSIS — M545 Low back pain: Secondary | ICD-10-CM | POA: Diagnosis present

## 2017-06-14 DIAGNOSIS — E119 Type 2 diabetes mellitus without complications: Secondary | ICD-10-CM | POA: Insufficient documentation

## 2017-06-14 DIAGNOSIS — M5136 Other intervertebral disc degeneration, lumbar region: Secondary | ICD-10-CM | POA: Insufficient documentation

## 2017-06-14 DIAGNOSIS — Z7984 Long term (current) use of oral hypoglycemic drugs: Secondary | ICD-10-CM | POA: Diagnosis not present

## 2017-06-14 DIAGNOSIS — I10 Essential (primary) hypertension: Secondary | ICD-10-CM | POA: Insufficient documentation

## 2017-06-14 MED ORDER — CYCLOBENZAPRINE HCL 10 MG PO TABS: 10.0000 mg | ORAL_TABLET | Freq: Three times a day (TID) | ORAL | 0 refills | Status: AC

## 2017-06-14 MED ORDER — HYDROCODONE-ACETAMINOPHEN 5-325 MG PO TABS
2.0000 | ORAL_TABLET | Freq: Once | ORAL | Status: AC
  Administered 2017-06-14: 2 via ORAL
  Filled 2017-06-14: qty 2

## 2017-06-14 MED ORDER — ONDANSETRON HCL 4 MG PO TABS
4.0000 mg | ORAL_TABLET | Freq: Once | ORAL | Status: AC
  Administered 2017-06-14: 4 mg via ORAL
  Filled 2017-06-14: qty 1

## 2017-06-14 MED ORDER — HYDROCODONE-ACETAMINOPHEN 5-325 MG PO TABS: 1.0000 | ORAL_TABLET | ORAL | 0 refills | Status: AC | PRN

## 2017-06-14 MED ORDER — DEXAMETHASONE 4 MG PO TABS: 4.0000 mg | ORAL_TABLET | Freq: Two times a day (BID) | ORAL | 0 refills | Status: DC

## 2017-06-14 MED ORDER — DIAZEPAM 5 MG PO TABS
5.0000 mg | ORAL_TABLET | Freq: Once | ORAL | Status: AC
  Administered 2017-06-14: 5 mg via ORAL
  Filled 2017-06-14: qty 1

## 2017-06-14 NOTE — ED Triage Notes (Signed)
Pt reports back pain while trying to get something in her refrigerator x1 week. Pt reports pain radiates to RLE and denies gi/gu symptoms. Pt reports seen for same a clinic.

## 2017-06-14 NOTE — Discharge Instructions (Signed)
The CT imaging of your lumbar spine reveals degenerative disc disease as well as arthritis being present.  There is one area that seems to be very close to or involving your spinal canal.  Please see Dr. Dion SaucierLandau, or the orthopedic specialist of your choice for follow-up with this issue.  Please use a heating pad when sitting or at rest.  Please use Flexeril 3 times daily.  Use Decadron 2 times daily with food.  Use Tylenol extra strength for mild pain, use Norco for more severe pain.  Norco and Flexeril may cause drowsiness.  Please do not drive, drink alcohol, operate machinery, or participate in activities requiring concentration when taking either of these medications.

## 2017-06-14 NOTE — ED Provider Notes (Signed)
[facility] EMERGENCY DEPARTMENT Provider Note   CSN: [id number] Arrival date & time: 06/14/17  1051     History   Chief Complaint Chief Complaint  Patient presents with  . Back Pain    HPI Jaime Jordan is a 57 y.o. female.  Patient is a 57 year old female who presents to the emergency department with a complaint of lower back pain.  The patient states that on January 1 she sustained a fall from a standing position.  She states since that time she has had to walk with a cane and has increasing pain of her lower back.  Most recently the pain is been radiating from the back down her right leg.  She denies loss of bowel or bladder function.  She has not lost any sensation in her saddle area.  She is not had previous operations or procedures involving her lower back.  She states that she does have some back pain from time to time but nothing in comparison to the pain she has had since January 1.  She has tried conservative measures at home.  The patient states she has been seen by a clinic in the [place] area, and nothing seems to be helping.   The history is provided by the patient.  Back Pain   Pertinent negatives include no chest pain, no abdominal pain and no dysuria.    Past Medical History:  Diagnosis Date  . Diabetes mellitus without complication (HCC)   . High cholesterol   . Hypertension   . Thyroid disease     There are no active problems to display for this patient.   Past Surgical History:  Procedure Laterality Date  . ABDOMINAL HYSTERECTOMY      OB History    No data available       Home Medications    Prior to Admission medications   Medication Sig Start Date End Date Taking? Authorizing Provider  antipyrine-benzocaine Lyla Son(AURALGAN) otic solution Place 3-4 drops into the right ear every 2 (two) hours as needed for ear pain. Compounded solution of 1% hydrocortisone and 2% benzocaine compounded solution. 08/02/16   Burgess AmorIdol, Julie, PA-C  atorvastatin (LIPITOR)  40 MG tablet Take 40 mg by mouth at bedtime. 07/07/15   [provider]  carvedilol (COREG) 3.125 MG tablet Take 3.125 mg by mouth daily.    [provider]  cyclobenzaprine (FLEXERIL) 10 MG tablet Take 1 tablet (10 mg total) by mouth 3 (three) times daily as needed. 04/04/16   Triplett, Tammy, PA-C  D3-1000 1000 units capsule Take 1,000 Units by mouth daily. 07/07/15   [provider]  esomeprazole (NEXIUM) 40 MG capsule Take 40 mg by mouth daily as needed (acid reflux).     [provider]  hydrochlorothiazide (HYDRODIURIL) 25 MG tablet Take 25 mg by mouth daily.    [provider]  HYDROcodone-acetaminophen (NORCO/VICODIN) 5-325 MG tablet Take one-two tabs po q 4-6 hrs prn pain 04/04/16   Triplett, Tammy, PA-C  levothyroxine (SYNTHROID, LEVOTHROID) 137 MCG tablet Take 137 mcg by mouth daily before breakfast.    [provider]  lisinopril (PRINIVIL,ZESTRIL) 40 MG tablet Take 40 mg by mouth daily. 06/29/15   [provider]  metFORMIN (GLUCOPHAGE) 500 MG tablet Take 500 mg by mouth 2 (two) times daily.    [provider]    Family History History reviewed. No pertinent family history.  Social History Social History   Tobacco Use  . Smoking status: Never Smoker  . Smokeless tobacco:  Never Used  Substance Use Topics  . Alcohol use: No  . Drug use: No     Allergies   Aspirin   Review of Systems Review of Systems  Constitutional: Negative for activity change.       All ROS Neg except as noted in HPI  HENT: Negative for nosebleeds.   Eyes: Negative for photophobia and discharge.  Respiratory: Negative for cough, shortness of breath and wheezing.   Cardiovascular: Negative for chest pain and palpitations.  Gastrointestinal: Negative for abdominal pain and blood in stool.  Genitourinary: Negative for dysuria, frequency and hematuria.  Musculoskeletal: Positive for back pain. Negative for arthralgias and neck  pain.  Skin: Negative.   Neurological: Negative for dizziness, seizures and speech difficulty.  Psychiatric/Behavioral: Negative for confusion and hallucinations.     Physical Exam Updated Vital Signs BP (!) 158/71 (BP Location: Right Arm)   Pulse 74   Temp 97.8 F (36.6 C) (Oral)   Resp 16   Ht 5\' 6"  (1.676 m)   Wt 122.5 kg (270 lb)   SpO2 100%   BMI 43.58 kg/m   Physical Exam  Constitutional: She is oriented to person, place, and time. She appears well-developed and well-nourished.  Non-toxic appearance.  HENT:  Head: Normocephalic.  Right Ear: Tympanic membrane and external ear normal.  Left Ear: Tympanic membrane and external ear normal.  Eyes: EOM and lids are normal. Pupils are equal, round, and reactive to light.  Neck: Normal range of motion. Neck supple. Carotid bruit is not present.  Cardiovascular: Normal rate, regular rhythm, normal heart sounds, intact distal pulses and normal pulses.  Pulmonary/Chest: Breath sounds normal. No respiratory distress.  Abdominal: Soft. Bowel sounds are normal. There is no tenderness. There is no guarding.  Musculoskeletal: Normal range of motion.       Lumbar back: She exhibits pain and spasm.       Back:  Lymphadenopathy:       Head (right side): No submandibular adenopathy present.       Head (left side): No submandibular adenopathy present.    She has no cervical adenopathy.  Neurological: She is alert and oriented to person, place, and time. She has normal strength. No cranial nerve deficit or sensory deficit.  Skin: Skin is warm and dry.  Psychiatric: She has a normal mood and affect. Her speech is normal.  Nursing note and vitals reviewed.    ED Treatments / Results  Labs (all labs ordered are listed, but only abnormal results are displayed) Labs Reviewed - No data to display  EKG  EKG Interpretation None       Radiology No results found.  Procedures Procedures (including critical care time)  Medications  Ordered in ED Medications - No data to display   Initial Impression / Assessment and Plan / ED Course  I have reviewed the triage vital signs and the nursing notes.  Pertinent labs & imaging results that were available during my care of the patient were reviewed by me and considered in my medical decision making (see chart for details).       Final Clinical Impressions(s) / ED Diagnoses MDM Patient sustained a fall on January 1.  Since that time she has required having a cane to use with her walking.  There is not been any loss of bowel or bladder function, or numbness in the saddle area.  We will check a urine to rule out urinary tract infection.  Will obtain imaging for right lower back  pain with radiation . CT of the lumbar spine shows some disc bulging at multiple sites.  There is moderate facet arthropathy on the right at the L3-L4 area.  There is some mild to moderate central canal narrowing also present.  At the L4-L5 area there is a disc bulge, there is moderate central canal stenosis present and there is narrowing of the right subarticular recess.  At the L5-S1 area there is a vacuum disc phenomena noted.  I discussed the findings with the patient in terms which she understands.  I have asked her to see orthopedics as soon as possible for additional evaluation of her back pain.  Patient reports some improvement in pain after medication.  Prescription for Flexeril, Norco, and Decadron given to the patient.  Patient will return to the emergency department if any changes, problems, or concerns.   Final diagnoses:  DDD (degenerative disc disease), lumbar    ED Discharge Orders        Ordered    cyclobenzaprine (FLEXERIL) 10 MG tablet  3 times daily     06/14/17 1310    HYDROcodone-acetaminophen (NORCO/VICODIN) 5-325 MG tablet  Every 4 hours PRN     06/14/17 1310    dexamethasone (DECADRON) 4 MG tablet  2 times daily with meals     06/14/17 1310       Ivery Quale,  PA-C 06/14/17 2148    Benjiman Core, MD 06/17/17 1153

## 2018-01-16 ENCOUNTER — Emergency Department (HOSPITAL_COMMUNITY): Payer: Medicare Other

## 2018-01-16 ENCOUNTER — Encounter (HOSPITAL_COMMUNITY): Payer: Self-pay

## 2018-01-16 ENCOUNTER — Emergency Department (HOSPITAL_COMMUNITY)
Admission: EM | Admit: 2018-01-16 | Discharge: 2018-01-16 | Disposition: A | Discharge status: Home / Self Care | Payer: Medicare Other | Attending: Emergency Medicine | Admitting: Emergency Medicine

## 2018-01-16 DIAGNOSIS — Z7984 Long term (current) use of oral hypoglycemic drugs: Secondary | ICD-10-CM | POA: Insufficient documentation

## 2018-01-16 DIAGNOSIS — Z79899 Other long term (current) drug therapy: Secondary | ICD-10-CM | POA: Diagnosis not present

## 2018-01-16 DIAGNOSIS — M1711 Unilateral primary osteoarthritis, right knee: Secondary | ICD-10-CM | POA: Diagnosis not present

## 2018-01-16 DIAGNOSIS — E119 Type 2 diabetes mellitus without complications: Secondary | ICD-10-CM | POA: Insufficient documentation

## 2018-01-16 DIAGNOSIS — M25561 Pain in right knee: Secondary | ICD-10-CM

## 2018-01-16 DIAGNOSIS — I10 Essential (primary) hypertension: Secondary | ICD-10-CM | POA: Diagnosis not present

## 2018-01-16 MED ORDER — PREDNISONE 20 MG PO TABS
40.0000 mg | ORAL_TABLET | Freq: Once | ORAL | Status: AC
  Administered 2018-01-16: 40 mg via ORAL
  Filled 2018-01-16: qty 2

## 2018-01-16 MED ORDER — DICLOFENAC SODIUM 1 % TD GEL: TRANSDERMAL | 1 refills | Status: AC

## 2018-01-16 MED ORDER — TRAMADOL HCL 50 MG PO TABS
100.0000 mg | ORAL_TABLET | Freq: Once | ORAL | Status: AC
  Administered 2018-01-16: 100 mg via ORAL
  Filled 2018-01-16: qty 2

## 2018-01-16 MED ORDER — ONDANSETRON HCL 4 MG PO TABS
4.0000 mg | ORAL_TABLET | Freq: Once | ORAL | Status: AC
  Administered 2018-01-16: 4 mg via ORAL
  Filled 2018-01-16: qty 1

## 2018-01-16 MED ORDER — DEXAMETHASONE 4 MG PO TABS: 4.0000 mg | ORAL_TABLET | Freq: Two times a day (BID) | ORAL | 0 refills | Status: AC

## 2018-01-16 NOTE — ED Provider Notes (Signed)
[facility] EMERGENCY DEPARTMENT Provider Note   CSN: [id number] Arrival date & time: 01/16/18  1516     History   Chief Complaint Chief Complaint  Patient presents with  . Knee Pain    HPI Jaime Jordan is a 57 y.o. female.  Patient is a 57 year old female who presents to the emergency department because of right knee pain.  The patient states that 2 weeks ago she was sitting in a chair putting closed in a closet when she lost her balance, fell on the right knee.  She says that she has been using conservative measures to try to take care of this, but the problem is not getting any better.  The pain is worse with bending, and walking.  The patient states she can walk on her right lower extremity.  No previous operations or procedures on the right lower extremity.  The history is provided by the patient.  Knee Pain      Past Medical History:  Diagnosis Date  . Diabetes mellitus without complication (HCC)   . High cholesterol   . Hypertension   . Thyroid disease     There are no active problems to display for this patient.   Past Surgical History:  Procedure Laterality Date  . ABDOMINAL HYSTERECTOMY       OB History   None      Home Medications    Prior to Admission medications   Medication Sig Start Date End Date Taking? Authorizing Provider  antipyrine-benzocaine Lyla Son) otic solution Place 3-4 drops into the right ear every 2 (two) hours as needed for ear pain. Compounded solution of 1% hydrocortisone and 2% benzocaine compounded solution. 08/02/16   Burgess Amor, PA-C  atorvastatin (LIPITOR) 40 MG tablet Take 40 mg by mouth at bedtime. 07/07/15   [provider]  carvedilol (COREG) 3.125 MG tablet Take 3.125 mg by mouth daily.    [provider]  cyclobenzaprine (FLEXERIL) 10 MG tablet Take 1 tablet (10 mg total) by mouth 3 (three) times daily. 06/14/17   Ivery Quale, PA-C  D3-1000 1000 units capsule Take 1,000 Units by mouth daily. 07/07/15    [provider]  dexamethasone (DECADRON) 4 MG tablet Take 1 tablet (4 mg total) by mouth 2 (two) times daily with a meal. 06/14/17   Ivery Quale, PA-C  esomeprazole (NEXIUM) 40 MG capsule Take 40 mg by mouth daily as needed (acid reflux).     [provider]  hydrochlorothiazide (HYDRODIURIL) 25 MG tablet Take 25 mg by mouth daily.    [provider]  HYDROcodone-acetaminophen (NORCO/VICODIN) 5-325 MG tablet Take 1 tablet by mouth every 4 (four) hours as needed. 06/14/17   Ivery Quale, PA-C  levothyroxine (SYNTHROID, LEVOTHROID) 137 MCG tablet Take 137 mcg by mouth daily before breakfast.    [provider]  lisinopril (PRINIVIL,ZESTRIL) 40 MG tablet Take 40 mg by mouth daily. 06/29/15   [provider]  metFORMIN (GLUCOPHAGE) 500 MG tablet Take 500 mg by mouth 2 (two) times daily.    [provider]    Family History No family history on file.  Social History Social History   Tobacco Use  . Smoking status: Never Smoker  . Smokeless tobacco: Never Used  Substance Use Topics  . Alcohol use: No  . Drug use: No     Allergies   Aspirin   Review of Systems Review of Systems  Constitutional: Negative for activity change.       All ROS Neg except  as noted in HPI  HENT: Negative for nosebleeds.   Eyes: Negative for photophobia and discharge.  Respiratory: Negative for cough, shortness of breath and wheezing.   Cardiovascular: Negative for chest pain and palpitations.  Gastrointestinal: Negative for abdominal pain and blood in stool.  Genitourinary: Negative for dysuria, frequency and hematuria.  Musculoskeletal: Positive for arthralgias. Negative for back pain and neck pain.  Skin: Negative.   Neurological: Negative for dizziness, seizures and speech difficulty.  Psychiatric/Behavioral: Negative for confusion and hallucinations.     Physical Exam Updated Vital Signs BP (!) 166/84 (BP Location: Right Arm)   Pulse 81    Temp 98.3 F (36.8 C) (Oral)   Resp 18   Ht 5\' 5"  (1.651 m)   Wt 112.9 kg   SpO2 96%   BMI 41.44 kg/m   Physical Exam  Constitutional: She is oriented to person, place, and time. She appears well-developed and well-nourished.  Non-toxic appearance.  HENT:  Head: Normocephalic.  Right Ear: Tympanic membrane and external ear normal.  Left Ear: Tympanic membrane and external ear normal.  Eyes: Pupils are equal, round, and reactive to light. EOM and lids are normal.  Neck: Normal range of motion. Neck supple. Carotid bruit is not present.  Cardiovascular: Normal rate, regular rhythm, normal heart sounds, intact distal pulses and normal pulses.  Pulmonary/Chest: Breath sounds normal. No respiratory distress.  Abdominal: Soft. Bowel sounds are normal. There is no tenderness. There is no guarding.  Musculoskeletal: She exhibits tenderness.  There is good range of motion of the right hip.  There is crepitus with flexion and extension of the right knee.  There is no effusion present.  There is no mass appreciated.  There is no deformity of the tibial area.  There is good range of motion of the right ankle.  There is some puffiness of the right ankle and foot.  The dorsalis pedis pulse on the right is 2+.  Lymphadenopathy:       Head (right side): No submandibular adenopathy present.       Head (left side): No submandibular adenopathy present.    She has no cervical adenopathy.  Neurological: She is alert and oriented to person, place, and time. She has normal strength. No cranial nerve deficit or sensory deficit.  Skin: Skin is warm and dry.  Psychiatric: She has a normal mood and affect. Her speech is normal.  Nursing note and vitals reviewed.    ED Treatments / Results  Labs (all labs ordered are listed, but only abnormal results are displayed) Labs Reviewed - No data to display  EKG None  Radiology Dg Knee Complete 4 Views Right  Result Date: 01/16/2018 CLINICAL DATA:  57 y/o  F;  fall 2 weeks ago with pain. EXAM: RIGHT KNEE - COMPLETE 4+ VIEW COMPARISON:  11/05/2011 right knee radiographs. FINDINGS: No evidence of fracture, dislocation, or joint effusion. Interval development of small tricompartmental periarticular osteophytes. Soft tissues are unremarkable. IMPRESSION: 1.  No acute fracture or dislocation identified. 2. Interval development of small tricompartmental osteophytes. Electronically Signed   By: Mitzi HansenLance  Furusawa-Stratton M.D.   On: 01/16/2018 16:50    Procedures Procedures (including critical care time)  Medications Ordered in ED Medications - No data to display   Initial Impression / Assessment and Plan / ED Course  I have reviewed the triage vital signs and the nursing notes.  Pertinent labs & imaging results that were available during my care of the patient were reviewed by me and considered  in my medical decision making (see chart for details).       Final Clinical Impressions(s) / ED Diagnoses MDM  Blood pressure is slightly elevated at 166/84, otherwise vital signs within normal limits.  The patient states she has had pain over the past 2 weeks involving the right knee.  The pain gets worse with standing or bending.  No difficulty with using the lower extremity.  No sensory deficits appreciated.  X-ray reveals tricompartment arthritis, but otherwise no fracture, no dislocation, no effusion.  I discussed with the patient that the fall may have aggravated arthritis that was already present.  I have asked the patient to see Dr. Romeo Apple for orthopedic evaluation and management.  I have prescribed diclofenac gel as well as short course of Decadron.  I will also asked the patient to use heat to the knee.  Patient is in agreement with this plan.   Final diagnoses:  Primary osteoarthritis of right knee  Right knee pain, unspecified chronicity    ED Discharge Orders    None       Ivery Quale, PA-C 01/16/18 1904    Maia Plan,  MD 01/17/18 1429

## 2018-01-16 NOTE — ED Triage Notes (Signed)
Pt reports falling 2 weeks ago and right knee hurts. Pt reports intermittent swelling in knee and foot

## 2018-01-16 NOTE — Discharge Instructions (Addendum)
The x-ray of your knee is negative for fracture, dislocation, or fluid in the joint.  It does show advanced arthritis in 3 compartments of your knee.  I suspect that the fall aggravated your arthritis and causes continued pain.  Please apply diclofenac gel to the knee 3 times daily.  Please use Decadron 2 times daily with a meal.  Heating pad to the knee will be helpful.  Please see the your primary physician for assistance with pain control.  Please see Dr. Romeo AppleHarrison, or the orthopedic specialist of your choice for additional evaluation and management of your knee pain.

## 2019-06-01 ENCOUNTER — Emergency Department (HOSPITAL_COMMUNITY): Payer: Medicare Other

## 2019-06-01 ENCOUNTER — Emergency Department (HOSPITAL_COMMUNITY)
Admission: EM | Admit: 2019-06-01 | Discharge: 2019-06-01 | Disposition: A | Discharge status: Home / Self Care | Payer: Medicare Other | Attending: Emergency Medicine | Admitting: Emergency Medicine

## 2019-06-01 ENCOUNTER — Other Ambulatory Visit: Payer: Self-pay

## 2019-06-01 ENCOUNTER — Encounter (HOSPITAL_COMMUNITY): Payer: Self-pay | Admitting: *Deleted

## 2019-06-01 DIAGNOSIS — I10 Essential (primary) hypertension: Secondary | ICD-10-CM | POA: Insufficient documentation

## 2019-06-01 DIAGNOSIS — N644 Mastodynia: Secondary | ICD-10-CM | POA: Insufficient documentation

## 2019-06-01 DIAGNOSIS — Z79899 Other long term (current) drug therapy: Secondary | ICD-10-CM | POA: Diagnosis not present

## 2019-06-01 DIAGNOSIS — M5431 Sciatica, right side: Secondary | ICD-10-CM | POA: Diagnosis present

## 2019-06-01 DIAGNOSIS — E119 Type 2 diabetes mellitus without complications: Secondary | ICD-10-CM | POA: Insufficient documentation

## 2019-06-01 DIAGNOSIS — Z7984 Long term (current) use of oral hypoglycemic drugs: Secondary | ICD-10-CM | POA: Diagnosis not present

## 2019-06-01 MED ORDER — LIDOCAINE 5 % EX PTCH: 1.0000 | MEDICATED_PATCH | CUTANEOUS | 0 refills | Status: AC

## 2019-06-01 MED ORDER — LIDOCAINE 5 % EX PTCH
1.0000 | MEDICATED_PATCH | CUTANEOUS | Status: DC
  Administered 2019-06-01: 1 via TRANSDERMAL
  Filled 2019-06-01: qty 1

## 2019-06-01 MED ORDER — MELOXICAM 7.5 MG PO TABS: 7.5000 mg | ORAL_TABLET | Freq: Every day | ORAL | 0 refills | Status: AC

## 2019-06-01 MED ORDER — MELOXICAM 7.5 MG PO TABS
7.5000 mg | ORAL_TABLET | Freq: Once | ORAL | Status: AC
  Administered 2019-06-01: 7.5 mg via ORAL
  Filled 2019-06-01: qty 1

## 2019-06-01 NOTE — Discharge Instructions (Addendum)
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  Use the medicine and the pain patch as directed.   Avoid lifting,  Bending,  Twisting or any other activity that worsens your pain over the next week.  Apply a heating pad to your lower back and hip area for 20 minutes several times daily which may help relieve your pain symptoms.  You should get rechecked if your symptoms are not better over the next week with this treatment, sooner if you develop increased pain, weakness in your leg(s) or loss of bladder or bowel function - these are symptoms of a worsening condition.  Your breast exam is reassuring today.  However, if your primary provider is recommending an additional test after the mammogram you had, I recommend contacting her (or the mammogram center) to arrange this for you.

## 2019-06-01 NOTE — ED Triage Notes (Signed)
Pain in right leg from hip to ankle for 3 days, pain in right breast for over a month, recently had normal mammogram per patient.states she needs a breast exam

## 2019-06-02 NOTE — ED Provider Notes (Signed)
[facility] EMERGENCY DEPARTMENT Provider Note   CSN: [id number] Arrival date & time: 06/01/19  7829     History Chief Complaint  Patient presents with  . Leg Pain  . Breast Pain    Jaime Jordan is a 58 y.o. adult with several complaints, the first being right buttock pain which radiates into her lateral leg to her mid calf region which started gradually 3 days ago.  She denies injury, reports the pain started as she was walking on a flat surface.  She does report a history of occasional low back pain, no midline pain currently.  Pain is worsened with movement and better at rest. She denies weakness or numbness in her leg and denies urinary or fecal incontinence or retention.   She has had no tx prior to arrival.  Secondly she has been experiencing right intermittent breast pain, described as random sharp stabs of pain which are fleeting and no reproducible.  She reports having some clear nipple discharge which started also over a month ago.  She underwent a mammogram in October in [place], which she reports was normal.  She was sent a letter from the provider stating this finding but also suggested she needed an additional test. She has not called to arrange this and cannot recall the type of test recommended.    The history is provided by the patient.       Past Medical History:  Diagnosis Date  . Diabetes mellitus without complication ([place])   . High cholesterol   . Hypertension   . Thyroid disease     There are no problems to display for this patient.   Past Surgical History:  Procedure Laterality Date  . ABDOMINAL HYSTERECTOMY       OB History   No obstetric history on file.     No family history on file.  Social History   Tobacco Use  . Smoking status: Never Smoker  . Smokeless tobacco: Never Used  Substance Use Topics  . Alcohol use: No  . Drug use: No    Home Medications Prior to Admission medications   Medication Sig Start Date End Date Taking?  Authorizing Provider  antipyrine-benzocaine Toniann Fail) otic solution Place 3-4 drops into the right ear every 2 (two) hours as needed for ear pain. Compounded solution of 1% hydrocortisone and 2% benzocaine compounded solution. 08/02/16   Evalee Jefferson, PA-C  atorvastatin (LIPITOR) 40 MG tablet Take 40 mg by mouth at bedtime. 07/07/15   [provider]  carvedilol (COREG) 3.125 MG tablet Take 3.125 mg by mouth daily.    [provider]  cyclobenzaprine (FLEXERIL) 10 MG tablet Take 1 tablet (10 mg total) by mouth 3 (three) times daily. 06/14/17   Lily Kocher, PA-C  D3-1000 1000 units capsule Take 1,000 Units by mouth daily. 07/07/15   [provider]  dexamethasone (DECADRON) 4 MG tablet Take 1 tablet (4 mg total) by mouth 2 (two) times daily with a meal. 01/16/18   Lily Kocher, PA-C  diclofenac sodium (VOLTAREN) 1 % GEL Apply to the right knee tid 01/16/18   Lily Kocher, PA-C  esomeprazole (NEXIUM) 40 MG capsule Take 40 mg by mouth daily as needed (acid reflux).     [provider]  hydrochlorothiazide (HYDRODIURIL) 25 MG tablet Take 25 mg by mouth daily.    [provider]  HYDROcodone-acetaminophen (NORCO/VICODIN) 5-325 MG tablet Take 1 tablet by mouth every 4 (four) hours as needed. 06/14/17   Lily Kocher, PA-C  levothyroxine (  SYNTHROID, LEVOTHROID) 137 MCG tablet Take 137 mcg by mouth daily before breakfast.    [provider]  lidocaine (LIDODERM) 5 % Place 1 patch onto the skin daily. Remove & Discard patch within 12 hours or as directed by MD 06/01/19   Burgess AmorIdol, Katelind Pytel, PA-C  lisinopril (PRINIVIL,ZESTRIL) 40 MG tablet Take 40 mg by mouth daily. 06/29/15   [provider]  meloxicam (MOBIC) 7.5 MG tablet Take 1 tablet (7.5 mg total) by mouth daily. 06/01/19   Burgess AmorIdol, Tauheedah Bok, PA-C  metFORMIN (GLUCOPHAGE) 500 MG tablet Take 500 mg by mouth 2 (two) times daily.    [provider]    Allergies    Aspirin  Review of Systems     Review of Systems  Constitutional: Negative for chills and fever.  HENT: Negative for congestion and sore throat.   Eyes: Negative.   Respiratory: Negative for chest tightness and shortness of breath.   Cardiovascular: Negative for chest pain.  Gastrointestinal: Negative for abdominal pain and nausea.  Genitourinary: Negative.  Negative for dysuria and enuresis.  Musculoskeletal: Positive for arthralgias. Negative for joint swelling and neck pain.  Skin: Negative.  Negative for rash and wound.  Neurological: Negative for dizziness, weakness, light-headedness, numbness and headaches.  Hematological: Negative for adenopathy.  Psychiatric/Behavioral: Negative.     Physical Exam Updated Vital Signs BP 118/61 (BP Location: Right Arm)   Pulse 65   Temp 98.4 F (36.9 C) (Oral)   Resp 16   Ht 5' 5.5" (1.664 m)   Wt 115.7 kg   SpO2 98%   BMI 41.79 kg/m   Physical Exam Vitals and nursing note reviewed.  Constitutional:      Appearance: He is well-developed.  HENT:     Head: Normocephalic and atraumatic.  Eyes:     Conjunctiva/sclera: Conjunctivae normal.  Cardiovascular:     Rate and Rhythm: Normal rate and regular rhythm.     Heart sounds: Normal heart sounds.     Comments: Pedal pulses normal. Pulmonary:     Effort: Pulmonary effort is normal.     Breath sounds: Normal breath sounds. No wheezing.  Chest:     Breasts:        Right: No swelling, bleeding, inverted nipple, mass, nipple discharge, skin change or tenderness.   Abdominal:     General: Bowel sounds are normal. There is no distension.     Palpations: Abdomen is soft. There is no mass.     Tenderness: There is no abdominal tenderness.  Musculoskeletal:        General: Normal range of motion.     Cervical back: Normal range of motion and neck supple.     Lumbar back: Tenderness present. No swelling, edema or spasms.     Comments: Pain across the right pelvic rim and buttock region with radiation along the  lateral thigh. No edema.  No rash.  No midline lumbar pain. Negative SLR.   Lymphadenopathy:     Upper Body:     Right upper body: No supraclavicular, axillary or pectoral adenopathy.  Skin:    General: Skin is warm and dry.  Neurological:     Mental Status: He is alert.     Sensory: No sensory deficit.     Motor: No tremor or atrophy.     Gait: Gait normal.     Deep Tendon Reflexes:     Reflex Scores:      Patellar reflexes are 2+ on the right side and 2+ on the  left side.      Achilles reflexes are 2+ on the right side and 2+ on the left side.    Comments: No strength deficit noted in hip and knee flexor and extensor muscle groups.  Ankle flexion and extension intact.     ED Results / Procedures / Treatments   Labs (all labs ordered are listed, but only abnormal results are displayed) Labs Reviewed - No data to display  EKG None  Radiology DG Lumbar Spine Complete  Result Date: 06/01/2019 CLINICAL DATA:  Radicular pain. EXAM: LUMBAR SPINE - COMPLETE 4+ VIEW COMPARISON:  No recent. FINDINGS: Lumbar spine scoliosis concave left. Diffuse degenerative change lumbar spine. No acute bony abnormality identified. No evidence of fracture. Aortoiliac atherosclerotic vascular calcification. Pelvic calcifications consistent phleboliths. IMPRESSION: Lumbar spine scoliosis concave left. Diffuse degenerative change lumbar spine. No acute bony abnormality identified. Electronically Signed   By: Maisie Fus  Register   On: 06/01/2019 14:33   DG HIP UNILAT WITH PELVIS 2-3 VIEWS RIGHT  Result Date: 06/01/2019 CLINICAL DATA:  History of pain on right. EXAM: DG HIP (WITH OR WITHOUT PELVIS) 2-3V RIGHT COMPARISON:  CT 04/04/2016. FINDINGS: Degenerative change lumbar spine and both hips. No acute bony or joint abnormality. No evidence of fracture or dislocation. Pelvic calcifications consistent phleboliths. IMPRESSION: Degenerative changes lumbar spine and both hips. No acute bony abnormality identified.  Electronically Signed   By: Maisie Fus  Register   On: 06/01/2019 14:36    Procedures Procedures (including critical care time)  Medications Ordered in ED Medications  meloxicam (MOBIC) tablet 7.5 mg (7.5 mg Oral Given 06/01/19 1452)    ED Course  I have reviewed the triage vital signs and the nursing notes.  Pertinent labs & imaging results that were available during my care of the patient were reviewed by me and considered in my medical decision making (see chart for details).    MDM Rules/Calculators/A&P                      Pt with exam and history suggesting sciatica of the right lower back. No neuro deficit on exam or by hx.  Pt prescribed meloxicam and lidoderm patches.  She has chronic right breast pain, intermittent, not current with no concerning signs for abscess or mass on exam.  It sounds like she has partially completed her work up with a normal mammogram.  It is unclear the nature of the follow up test recommended, possibly an ultrasound?   Pt was strongly encouraged to call her provider to clarify and arrange any recommended f/u tests.  She understands todays plan and will call to arrange f/u care.  Final Clinical Impression(s) / ED Diagnoses Final diagnoses:  Sciatica of right side  Breast pain, right    Rx / DC Orders ED Discharge Orders         Ordered    lidocaine (LIDODERM) 5 %  Every 24 hours     06/01/19 1457    meloxicam (MOBIC) 7.5 MG tablet  Daily     06/01/19 1457           Burgess Amor, PA-C 06/02/19 2030    Maia Plan, MD 06/04/19 1351

## 2020-06-30 IMAGING — DX DG LUMBAR SPINE COMPLETE 4+V
5 series · 5 of 5 positions shown · non-contrast
Comparison: No recent.

CLINICAL DATA: Radicular pain.

EXAM:
LUMBAR SPINE - COMPLETE 4+ VIEW

[l-spine obl (1 of 2)]
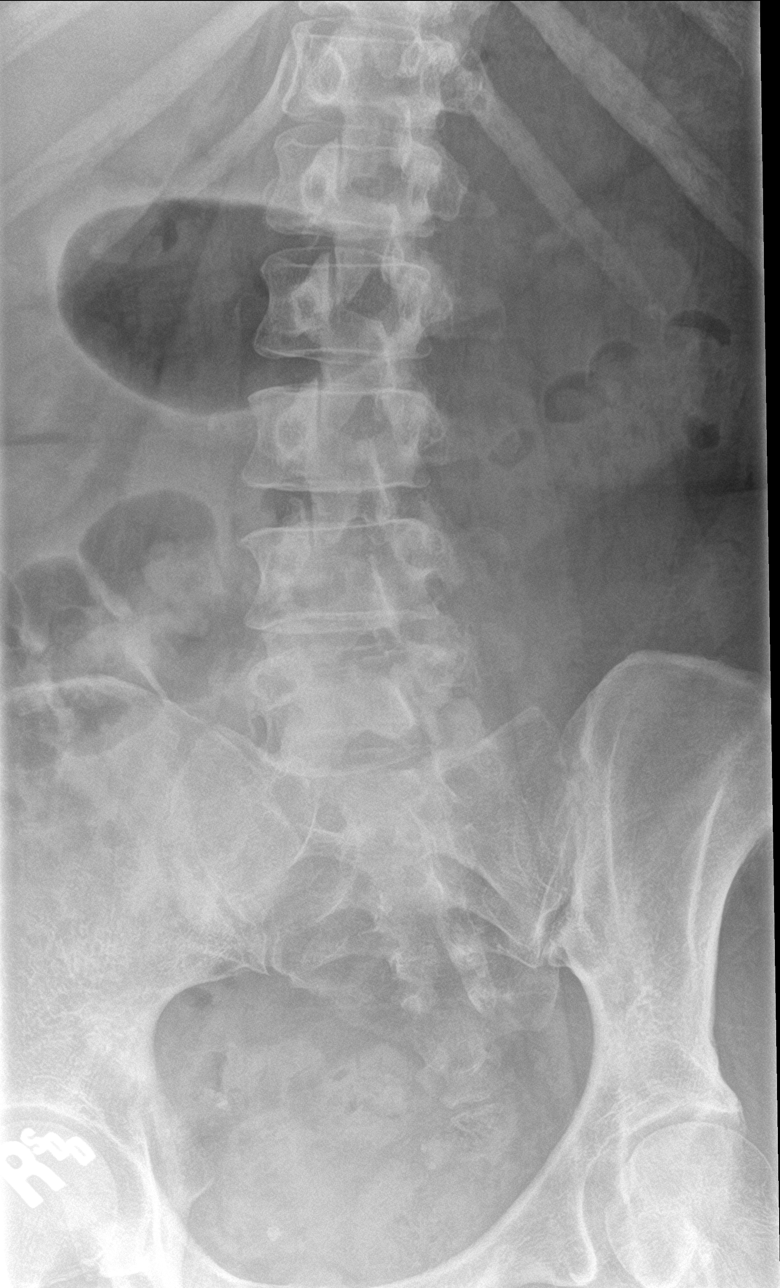

[l-spine obl (2 of 2)]
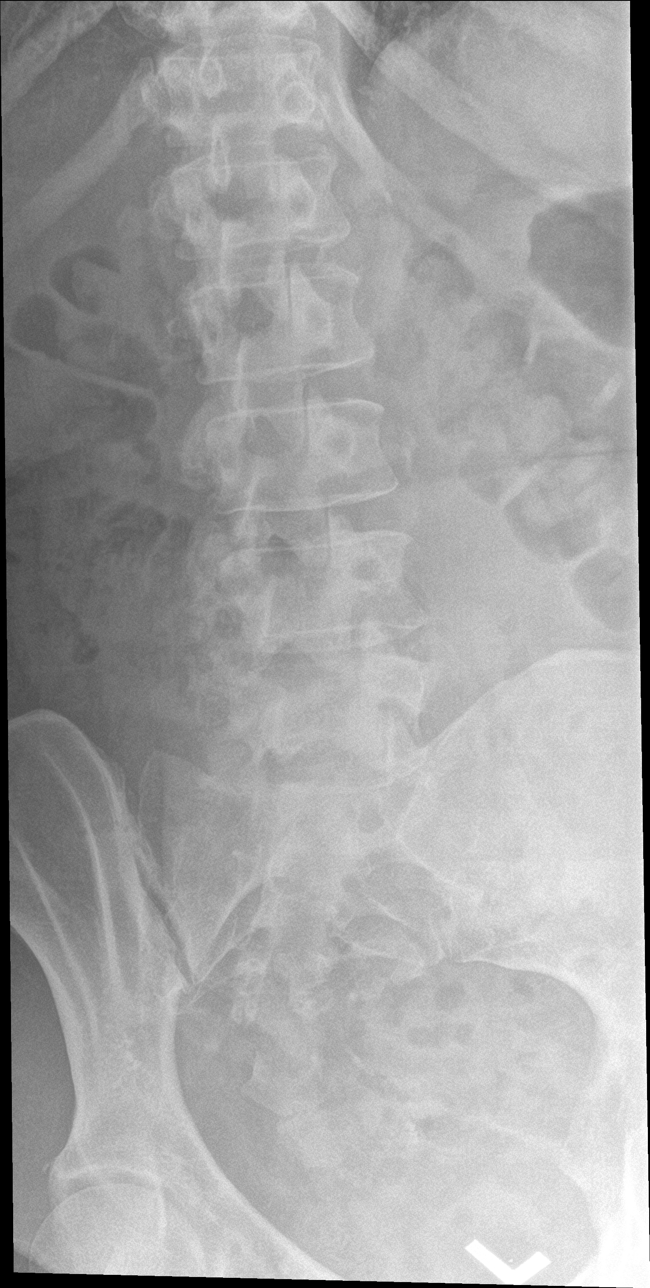

[l-spine lat]
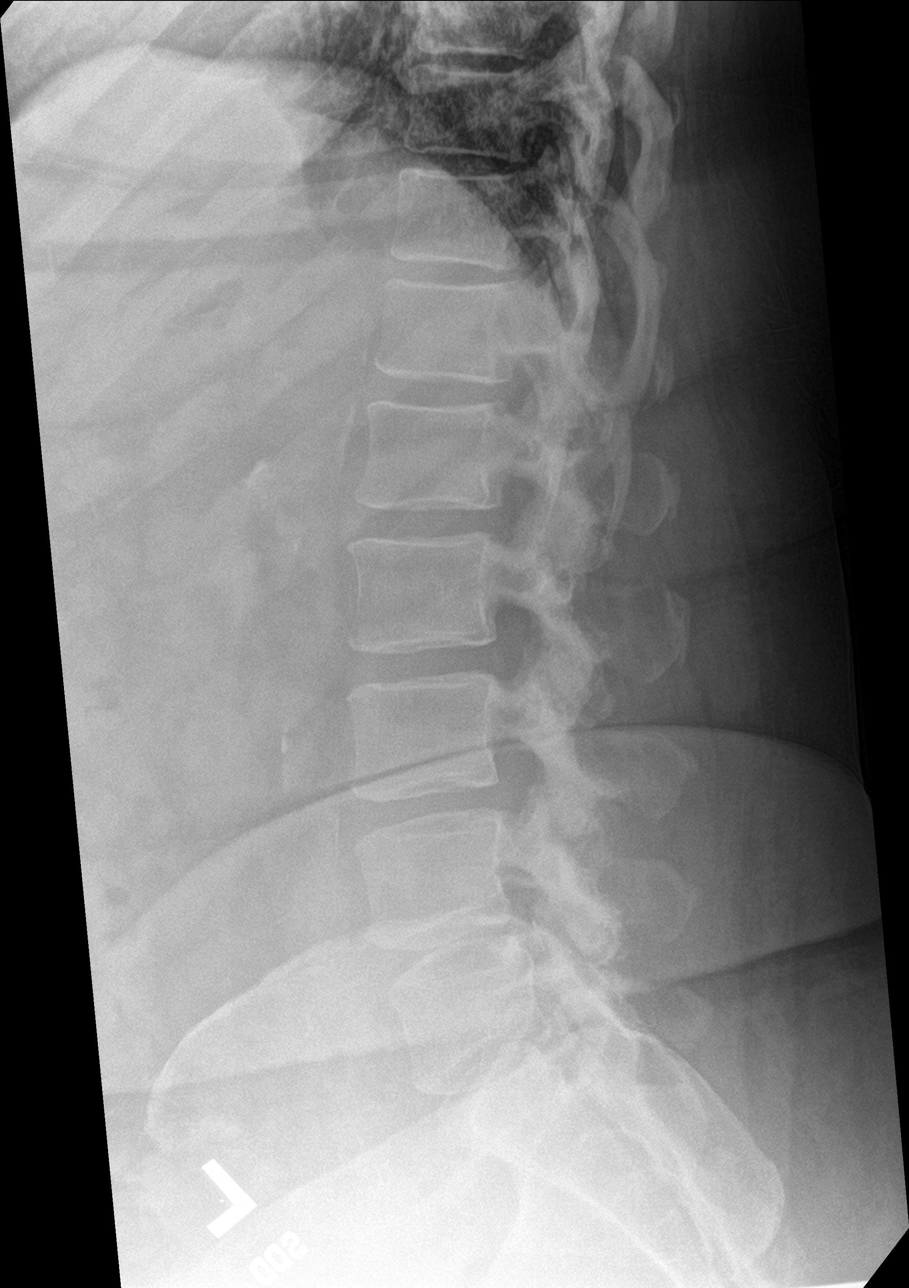

[l-spine spot]
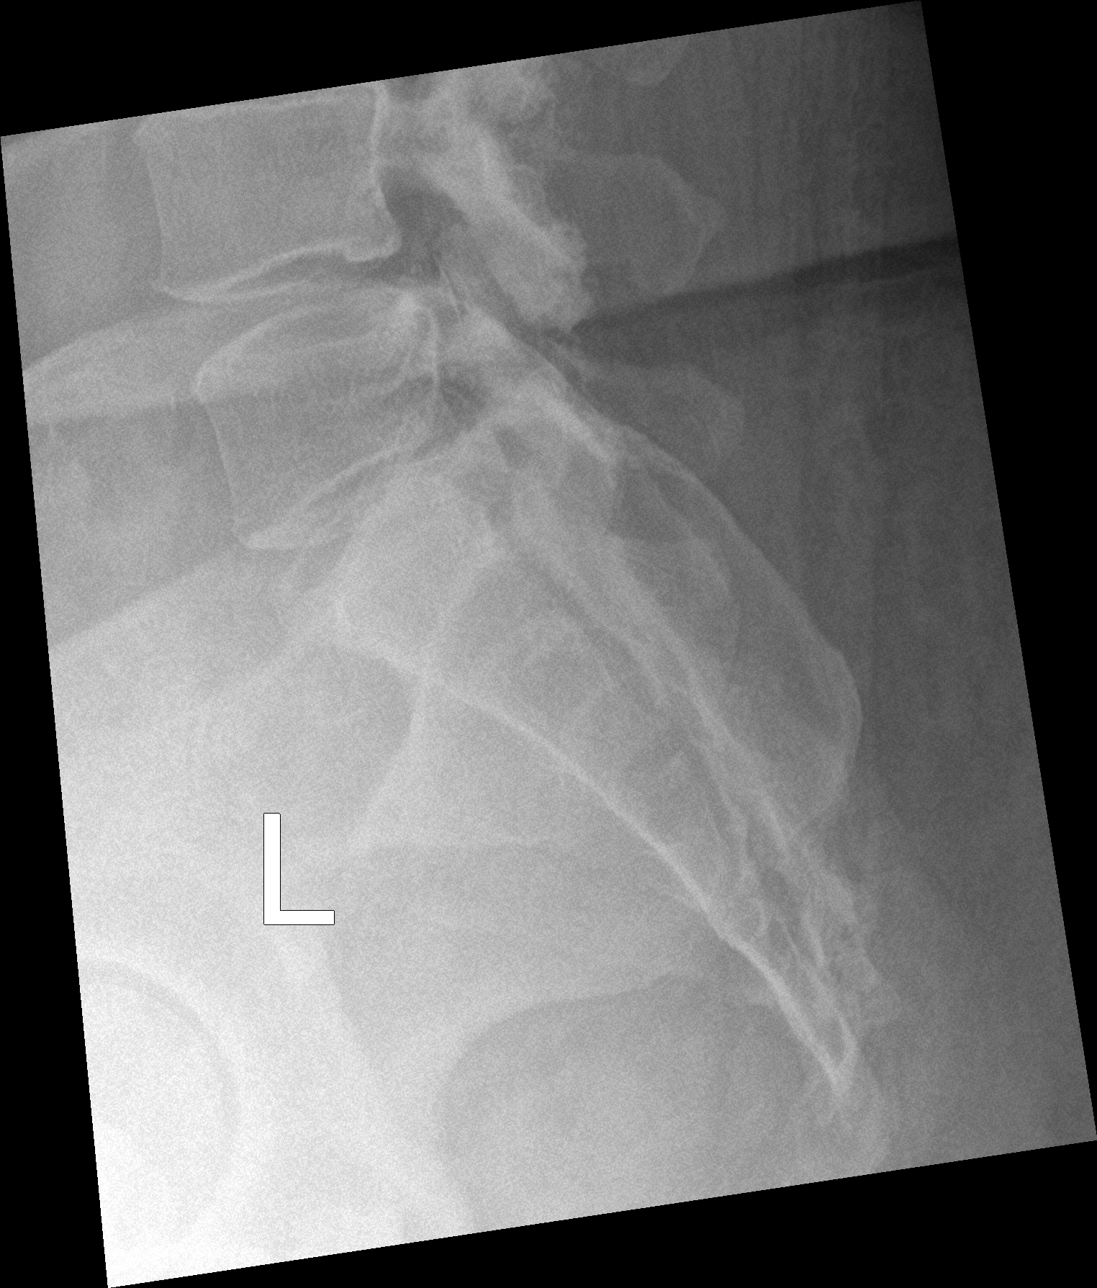

[l-spine ap]
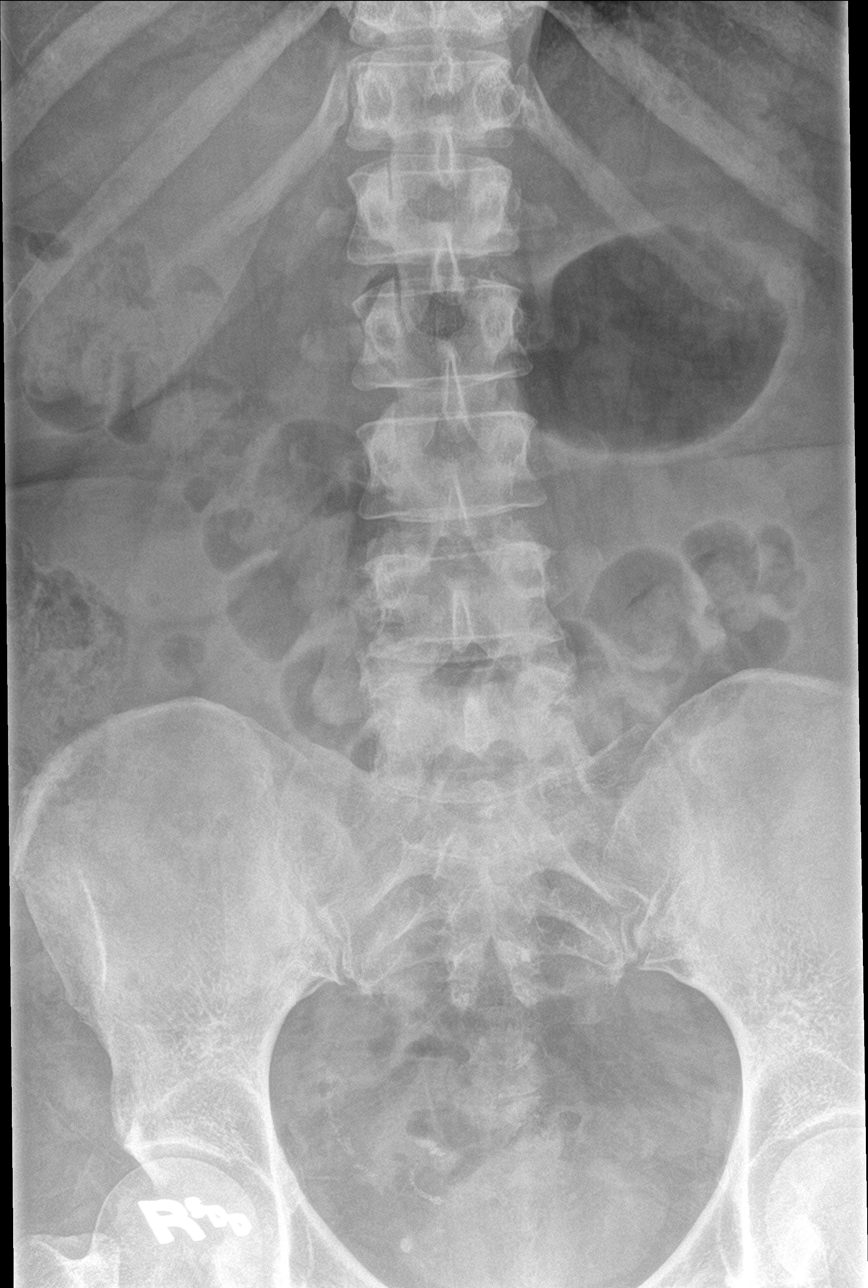

[5 of 5 positions shown; findings below may reference images not displayed]

FINDINGS: Lumbar spine scoliosis concave left. Diffuse degenerative change
lumbar spine. No acute bony abnormality identified. No evidence of
fracture. Aortoiliac atherosclerotic vascular calcification. Pelvic
calcifications consistent phleboliths.
IMPRESSION: Lumbar spine scoliosis concave left. Diffuse degenerative change
lumbar spine. No acute bony abnormality identified.

## 2023-03-29 ENCOUNTER — Emergency Department (HOSPITAL_COMMUNITY)
Admission: EM | Admit: 2023-03-29 | Discharge: 2023-03-29 | Disposition: A | Discharge status: Home / Self Care | Payer: Medicare (Managed Care) | Attending: Emergency Medicine | Admitting: Emergency Medicine

## 2023-03-29 ENCOUNTER — Encounter (HOSPITAL_COMMUNITY): Payer: Self-pay

## 2023-03-29 DIAGNOSIS — Z1152 Encounter for screening for COVID-19: Secondary | ICD-10-CM | POA: Insufficient documentation

## 2023-03-29 DIAGNOSIS — J069 Acute upper respiratory infection, unspecified: Secondary | ICD-10-CM | POA: Diagnosis not present

## 2023-03-29 DIAGNOSIS — R059 Cough, unspecified: Secondary | ICD-10-CM | POA: Diagnosis present

## 2023-03-29 LAB — SARS CORONAVIRUS 2 BY RT PCR: SARS Coronavirus 2 by RT PCR: NEGATIVE

## 2023-03-29 MED ORDER — ALBUTEROL SULFATE HFA 108 (90 BASE) MCG/ACT IN AERS: 2.0000 | INHALATION_SPRAY | RESPIRATORY_TRACT | 3 refills | Status: AC | PRN

## 2023-03-29 MED ORDER — ALBUTEROL SULFATE HFA 108 (90 BASE) MCG/ACT IN AERS: 2.0000 | INHALATION_SPRAY | RESPIRATORY_TRACT | 3 refills | Status: DC | PRN

## 2023-03-29 MED ORDER — BENZONATATE 100 MG PO CAPS: 100.0000 mg | ORAL_CAPSULE | Freq: Three times a day (TID) | ORAL | 0 refills | Status: DC

## 2023-03-29 MED ORDER — BENZONATATE 100 MG PO CAPS: 100.0000 mg | ORAL_CAPSULE | Freq: Three times a day (TID) | ORAL | 0 refills | Status: AC

## 2023-03-29 MED ORDER — BENZONATATE 100 MG PO CAPS
200.0000 mg | ORAL_CAPSULE | Freq: Once | ORAL | Status: AC
  Administered 2023-03-29: 200 mg via ORAL
  Filled 2023-03-29: qty 2

## 2023-03-29 NOTE — ED Provider Notes (Signed)
[place] EMERGENCY DEPARTMENT AT [facility] Provider Note   CSN: [id number] Arrival date & time: 03/29/23  1929     History  Chief Complaint  Patient presents with   Cough    Jaime Jordan is a 62 y.o. female.   Cough  This patient is a 62 year old female, has been exposed to her significant other who has been sick this week, she has had sore throat runny nose and a cough, cough seems to get worse at night, no vomiting, no diarrhea, not taking any medications at this time    Home Medications Prior to Admission medications   Medication Sig Start Date End Date Taking? Authorizing Provider  albuterol (VENTOLIN HFA) 108 (90 Base) MCG/ACT inhaler Inhale 2 puffs into the lungs every 4 (four) hours as needed for wheezing or shortness of breath. 03/29/23  Yes Eber Hong, MD  benzonatate (TESSALON) 100 MG capsule Take 1 capsule (100 mg total) by mouth every 8 (eight) hours. 03/29/23  Yes Eber Hong, MD  antipyrine-benzocaine Lyla Son) otic solution Place 3-4 drops into the right ear every 2 (two) hours as needed for ear pain. Compounded solution of 1% hydrocortisone and 2% benzocaine compounded solution. 08/02/16   Burgess Amor, PA-C  atorvastatin (LIPITOR) 40 MG tablet Take 40 mg by mouth at bedtime. 07/07/15   [provider]  carvedilol (COREG) 3.125 MG tablet Take 3.125 mg by mouth daily.    [provider]  cyclobenzaprine (FLEXERIL) 10 MG tablet Take 1 tablet (10 mg total) by mouth 3 (three) times daily. 06/14/17   Ivery Quale, PA-C  D3-1000 1000 units capsule Take 1,000 Units by mouth daily. 07/07/15   [provider]  dexamethasone (DECADRON) 4 MG tablet Take 1 tablet (4 mg total) by mouth 2 (two) times daily with a meal. 01/16/18   Ivery Quale, PA-C  diclofenac sodium (VOLTAREN) 1 % GEL Apply to the right knee tid 01/16/18   Ivery Quale, PA-C  esomeprazole (NEXIUM) 40 MG capsule Take 40 mg by mouth daily as needed (acid reflux).      [provider]  hydrochlorothiazide (HYDRODIURIL) 25 MG tablet Take 25 mg by mouth daily.    [provider]  HYDROcodone-acetaminophen (NORCO/VICODIN) 5-325 MG tablet Take 1 tablet by mouth every 4 (four) hours as needed. 06/14/17   Ivery Quale, PA-C  levothyroxine (SYNTHROID, LEVOTHROID) 137 MCG tablet Take 137 mcg by mouth daily before breakfast.    [provider]  lidocaine (LIDODERM) 5 % Place 1 patch onto the skin daily. Remove & Discard patch within 12 hours or as directed by MD 06/01/19   Burgess Amor, PA-C  lisinopril (PRINIVIL,ZESTRIL) 40 MG tablet Take 40 mg by mouth daily. 06/29/15   [provider]  meloxicam (MOBIC) 7.5 MG tablet Take 1 tablet (7.5 mg total) by mouth daily. 06/01/19   Burgess Amor, PA-C  metFORMIN (GLUCOPHAGE) 500 MG tablet Take 500 mg by mouth 2 (two) times daily.    [provider]      Allergies    Aspirin    Review of Systems   Review of Systems  Respiratory:  Positive for cough.   All other systems reviewed and are negative.   Physical Exam Updated Vital Signs BP 107/74   Pulse 64   Temp 98.7 F (37.1 C) (Oral)   Resp 18   Ht 1.676 m (5\' 6" )   Wt 121.1 kg   SpO2 100%   BMI 43.09 kg/m  Physical Exam Vitals and nursing note  reviewed.  Constitutional:      General: She is not in acute distress.    Appearance: She is well-developed.  HENT:     Head: Normocephalic and atraumatic.     Mouth/Throat:     Mouth: Mucous membranes are moist.     Pharynx: No oropharyngeal exudate.     Comments: Tonsils are slightly enlarged, there is no signs of peritonsillar abscess, uvula is midline, phonation is normal, no trismus or torticollis Eyes:     General: No scleral icterus.       Right eye: No discharge.        Left eye: No discharge.     Conjunctiva/sclera: Conjunctivae normal.     Pupils: Pupils are equal, round, and reactive to light.  Neck:     Thyroid: No thyromegaly.     Vascular: No JVD.   Cardiovascular:     Rate and Rhythm: Normal rate and regular rhythm.     Heart sounds: Normal heart sounds. No murmur heard.    No friction rub. No gallop.  Pulmonary:     Effort: Pulmonary effort is normal. No respiratory distress.     Breath sounds: Normal breath sounds. No wheezing or rales.  Abdominal:     General: Bowel sounds are normal. There is no distension.     Palpations: Abdomen is soft. There is no mass.     Tenderness: There is no abdominal tenderness.  Musculoskeletal:        General: No tenderness. Normal range of motion.     Cervical back: Normal range of motion and neck supple.     Right lower leg: No edema.     Left lower leg: No edema.  Lymphadenopathy:     Cervical: No cervical adenopathy.  Skin:    General: Skin is warm and dry.     Findings: No erythema or rash.  Neurological:     Mental Status: She is alert.     Coordination: Coordination normal.  Psychiatric:        Behavior: Behavior normal.     ED Results / Procedures / Treatments   Labs (all labs ordered are listed, but only abnormal results are displayed) Labs Reviewed  SARS CORONAVIRUS 2 BY RT PCR    EKG None  Radiology No results found.  Procedures Procedures    Medications Ordered in ED Medications  benzonatate (TESSALON) capsule 200 mg (200 mg Oral Given 03/29/23 2036)    ED Course/ Medical Decision Making/ A&P                                 Medical Decision Making Risk Prescription drug management.   Totally clear lung sounds, likely viral, check COVID, otherwise patient has viral syndrome with normal vitals and can be discharged home safely with supportive care including an antitussive and bronchodilators.  Patient agreeable  COVID testing negative, patient very well-appearing, vitals normal, stable for discharge, supportive care        Final Clinical Impression(s) / ED Diagnoses Final diagnoses:  Viral URI with cough    Rx / DC Orders ED Discharge  Orders          Ordered    benzonatate (TESSALON) 100 MG capsule  Every 8 hours        03/29/23 2219    albuterol (VENTOLIN HFA) 108 (90 Base) MCG/ACT inhaler  Every 4 hours PRN  03/29/23 2219              Eber Hong, MD 03/29/23 2220

## 2023-03-29 NOTE — ED Triage Notes (Signed)
Pt stated that she thinks she got a cold from her husband and wants to be checked out. Pt stated that she has been coughing a lot and having some SOB since the cold began a week ago. Pt stated sore throat and cough. Not been tested for covid

## 2023-03-29 NOTE — Discharge Instructions (Signed)
Your symptoms and your exam is consistent with having a virus, this will likely last for a week or so, you can use the albuterol and the Tessalon to help with coughing and shortness of breath, ER for worsening symptoms, see your doctor in 3 to 4 days if still having symptoms  Albuterol is an inhaled medication which can help you to breathe better, you should take 2 puffs every 4 hours as needed, this may cause your heart to feel like it is racing, this should be a temporary side effect.  Tessalon is a cough medication that helps reduce the amount of coughing that you are having.  You may take up to 200 mg every 8 hours as needed.  This can be used safely with most or other over-the-counter medications but talk to your pharmacist before taking anything else over-the-counter with it  Thank you for allowing [place] to treat you in the emergency department today.  After reviewing your examination and potential testing that was done it appears that you are safe to go home.  I would like for you to follow-up with your doctor within the next several days, have them obtain your records and follow-up with them to review all potential tests and results from your visit.  If you should develop severe or worsening symptoms return to the emergency department immediately
